# Patient Record
Sex: Female | Born: 1935 | Race: White | Hispanic: No | Smoking: Never smoker
Health system: Southern US, Community
[De-identification: ages and names within clinical notes are randomized; demographics above are authoritative.]

## PROBLEM LIST (undated history)

## (undated) DIAGNOSIS — I509 Heart failure, unspecified: Secondary | ICD-10-CM

## (undated) DIAGNOSIS — Z8719 Personal history of other diseases of the digestive system: Secondary | ICD-10-CM

## (undated) DIAGNOSIS — F419 Anxiety disorder, unspecified: Secondary | ICD-10-CM

## (undated) DIAGNOSIS — I10 Essential (primary) hypertension: Secondary | ICD-10-CM

## (undated) DIAGNOSIS — E7800 Pure hypercholesterolemia, unspecified: Secondary | ICD-10-CM

## (undated) DIAGNOSIS — F32A Depression, unspecified: Secondary | ICD-10-CM

## (undated) DIAGNOSIS — K219 Gastro-esophageal reflux disease without esophagitis: Secondary | ICD-10-CM

## (undated) DIAGNOSIS — F329 Major depressive disorder, single episode, unspecified: Secondary | ICD-10-CM

## (undated) DIAGNOSIS — I2510 Atherosclerotic heart disease of native coronary artery without angina pectoris: Secondary | ICD-10-CM

## (undated) DIAGNOSIS — R519 Headache, unspecified: Secondary | ICD-10-CM

## (undated) DIAGNOSIS — I350 Nonrheumatic aortic (valve) stenosis: Secondary | ICD-10-CM

## (undated) DIAGNOSIS — R51 Headache: Secondary | ICD-10-CM

## (undated) DIAGNOSIS — I340 Nonrheumatic mitral (valve) insufficiency: Secondary | ICD-10-CM

## (undated) DIAGNOSIS — I219 Acute myocardial infarction, unspecified: Secondary | ICD-10-CM

## (undated) DIAGNOSIS — K449 Diaphragmatic hernia without obstruction or gangrene: Secondary | ICD-10-CM

## (undated) DIAGNOSIS — Z8711 Personal history of peptic ulcer disease: Secondary | ICD-10-CM

## (undated) DIAGNOSIS — G8929 Other chronic pain: Secondary | ICD-10-CM

## (undated) HISTORY — PX: ABDOMINAL HYSTERECTOMY: SHX81

## (undated) HISTORY — PX: SHOULDER SURGERY: SHX246

## (undated) HISTORY — PX: HIP FRACTURE SURGERY: SHX118

---

## 2002-07-12 ENCOUNTER — Emergency Department (HOSPITAL_COMMUNITY): Admission: EM | Admit: 2002-07-12 | Discharge: 2002-07-12 | Payer: Self-pay | Admitting: *Deleted

## 2002-07-12 ENCOUNTER — Encounter: Payer: Self-pay | Admitting: *Deleted

## 2002-07-16 ENCOUNTER — Ambulatory Visit (HOSPITAL_COMMUNITY): Admission: RE | Admit: 2002-07-16 | Discharge: 2002-07-16 | Payer: Self-pay | Admitting: *Deleted

## 2002-07-16 ENCOUNTER — Encounter: Payer: Self-pay | Admitting: *Deleted

## 2002-07-23 ENCOUNTER — Encounter: Payer: Self-pay | Admitting: Emergency Medicine

## 2002-07-23 ENCOUNTER — Inpatient Hospital Stay (HOSPITAL_COMMUNITY): Admission: EM | Admit: 2002-07-23 | Discharge: 2002-07-27 | Payer: Self-pay | Admitting: Emergency Medicine

## 2002-07-25 ENCOUNTER — Encounter (INDEPENDENT_AMBULATORY_CARE_PROVIDER_SITE_OTHER): Payer: Self-pay | Admitting: Internal Medicine

## 2002-10-21 ENCOUNTER — Other Ambulatory Visit: Admission: RE | Admit: 2002-10-21 | Discharge: 2002-10-21 | Payer: Self-pay | Admitting: Dermatology

## 2003-01-28 ENCOUNTER — Inpatient Hospital Stay (HOSPITAL_COMMUNITY): Admission: EM | Admit: 2003-01-28 | Discharge: 2003-01-30 | Payer: Self-pay | Admitting: Emergency Medicine

## 2003-01-28 ENCOUNTER — Encounter: Payer: Self-pay | Admitting: Emergency Medicine

## 2003-05-31 DIAGNOSIS — I219 Acute myocardial infarction, unspecified: Secondary | ICD-10-CM

## 2003-05-31 HISTORY — DX: Acute myocardial infarction, unspecified: I21.9

## 2006-07-20 ENCOUNTER — Ambulatory Visit (HOSPITAL_COMMUNITY): Admission: RE | Admit: 2006-07-20 | Discharge: 2006-07-20 | Payer: Self-pay | Admitting: Pulmonary Disease

## 2006-07-28 ENCOUNTER — Emergency Department (HOSPITAL_COMMUNITY): Admission: EM | Admit: 2006-07-28 | Discharge: 2006-07-28 | Payer: Self-pay | Admitting: Emergency Medicine

## 2006-08-31 ENCOUNTER — Encounter (HOSPITAL_COMMUNITY): Admission: RE | Admit: 2006-08-31 | Discharge: 2006-09-30 | Payer: Self-pay | Admitting: Pulmonary Disease

## 2006-10-02 ENCOUNTER — Encounter (HOSPITAL_COMMUNITY): Admission: RE | Admit: 2006-10-02 | Discharge: 2006-11-01 | Payer: Self-pay | Admitting: Pulmonary Disease

## 2006-11-03 ENCOUNTER — Encounter (HOSPITAL_COMMUNITY): Admission: RE | Admit: 2006-11-03 | Discharge: 2006-12-03 | Payer: Self-pay | Admitting: Pulmonary Disease

## 2006-12-04 ENCOUNTER — Encounter (HOSPITAL_COMMUNITY): Admission: RE | Admit: 2006-12-04 | Discharge: 2006-12-12 | Payer: Self-pay | Admitting: Pulmonary Disease

## 2006-12-13 ENCOUNTER — Encounter (HOSPITAL_COMMUNITY): Admission: RE | Admit: 2006-12-13 | Discharge: 2007-01-12 | Payer: Self-pay | Admitting: Pulmonary Disease

## 2007-01-15 ENCOUNTER — Encounter (HOSPITAL_COMMUNITY): Admission: RE | Admit: 2007-01-15 | Discharge: 2007-02-14 | Payer: Self-pay | Admitting: Pulmonary Disease

## 2007-02-15 ENCOUNTER — Encounter (HOSPITAL_COMMUNITY): Admission: RE | Admit: 2007-02-15 | Discharge: 2007-02-27 | Payer: Self-pay | Admitting: Pulmonary Disease

## 2007-03-19 ENCOUNTER — Inpatient Hospital Stay (HOSPITAL_COMMUNITY): Admission: EM | Admit: 2007-03-19 | Discharge: 2007-03-21 | Payer: Self-pay | Admitting: Emergency Medicine

## 2007-04-04 ENCOUNTER — Ambulatory Visit (HOSPITAL_COMMUNITY): Admission: EM | Admit: 2007-04-04 | Discharge: 2007-04-04 | Payer: Self-pay | Admitting: *Deleted

## 2007-04-16 ENCOUNTER — Ambulatory Visit (HOSPITAL_COMMUNITY): Admission: RE | Admit: 2007-04-16 | Discharge: 2007-04-16 | Payer: Self-pay | Admitting: Pulmonary Disease

## 2007-04-25 ENCOUNTER — Encounter (HOSPITAL_COMMUNITY): Admission: RE | Admit: 2007-04-25 | Discharge: 2007-05-25 | Payer: Self-pay | Admitting: Pulmonary Disease

## 2007-05-28 ENCOUNTER — Encounter (HOSPITAL_COMMUNITY): Admission: RE | Admit: 2007-05-28 | Discharge: 2007-05-30 | Payer: Self-pay | Admitting: Pulmonary Disease

## 2007-09-03 ENCOUNTER — Inpatient Hospital Stay (HOSPITAL_COMMUNITY): Admission: RE | Admit: 2007-09-03 | Discharge: 2007-09-06 | Payer: Self-pay | Admitting: Orthopedic Surgery

## 2007-12-21 ENCOUNTER — Encounter (HOSPITAL_COMMUNITY): Admission: RE | Admit: 2007-12-21 | Discharge: 2008-01-20 | Payer: Self-pay | Admitting: Pulmonary Disease

## 2008-01-21 ENCOUNTER — Encounter: Admission: RE | Admit: 2008-01-21 | Discharge: 2008-02-20 | Payer: Self-pay | Admitting: Pulmonary Disease

## 2008-02-22 ENCOUNTER — Encounter (HOSPITAL_COMMUNITY): Admission: RE | Admit: 2008-02-22 | Discharge: 2008-03-23 | Payer: Self-pay | Admitting: Pulmonary Disease

## 2008-04-02 ENCOUNTER — Encounter (HOSPITAL_COMMUNITY): Admission: RE | Admit: 2008-04-02 | Discharge: 2008-05-02 | Payer: Self-pay | Admitting: Pulmonary Disease

## 2008-05-24 ENCOUNTER — Emergency Department (HOSPITAL_COMMUNITY): Admission: EM | Admit: 2008-05-24 | Discharge: 2008-05-24 | Payer: Self-pay | Admitting: Emergency Medicine

## 2008-08-27 ENCOUNTER — Encounter (HOSPITAL_COMMUNITY): Admission: RE | Admit: 2008-08-27 | Discharge: 2008-09-26 | Payer: Self-pay | Admitting: Pulmonary Disease

## 2008-09-29 ENCOUNTER — Encounter (HOSPITAL_COMMUNITY): Admission: RE | Admit: 2008-09-29 | Discharge: 2008-10-29 | Payer: Self-pay | Admitting: Pulmonary Disease

## 2010-10-12 NOTE — H&P (Signed)
NAMEJAZLEN, OGARRO               ACCOUNT NO.:  [id number]   MEDICAL RECORD NO.:  [id number]          PATIENT TYPE:  INP   LOCATION:  A227                          FACILITY:  [facility]   PHYSICIAN:  Edward L. Juanetta Gosling, M.D.DATE OF BIRTH:  21-Aug-1919   DATE OF ADMISSION:  03/19/2007  DATE OF DISCHARGE:  LH                              HISTORY & PHYSICAL   HISTORY OF PRESENT ILLNESS:  Ms.  Chiao came to my office on the  morning of admission complaining of chest pain, and I told her she  needed to go to the emergency room rather than come to my office.  She  said it started about 12 hours prior to admission.  She did have some  cough.  She had pain when she coughed, but she has a history of  myocardial infarction and had congestive heart failure following that,  so it was felt better not to take any chances.  Her pain is described as  dull and squeezing.  It was aggravated by cough.  She had any fever.  She had not coughed anything up.   PAST MEDICAL HISTORY:  1. Coronary artery occlusive disease status post MI.  She has      congestive heart failure following that, although she has had a      markedly improved echocardiogram since.  2. Hypertension.  3. Chronic headaches.   SOCIAL HISTORY:  She has been living alone, but recently living with a  family member here in town.  She is widowed.  She does not smoke.  She  does not drink any alcohol.  She does not use any illicit drugs.   FAMILY HISTORY:  Both parents died of strokes.  She has a brother who  has high blood pressure another one who died of a throat cancer.   MEDICATIONS AT HOME:  1. Potassium chloride 20 mEq daily.  2. Protonix 40 mg daily.  3. Zocor 40 mg daily.  4. Ambien 10 mg at bedtime.  5. Enteric-coated aspirin 81 mg daily.  6. Coreg 3.125 mg b.i.d.  7. Lanoxin 125 mcg daily.  8. Lasix 40 mg daily.  9. Lexapro 10 mg daily.  10.Xanax 0.25 mg b.i.d. if needed.   REVIEW OF SYSTEMS:  She has had significant  problems with  anxiety/depression following her MI and CHF.  She also does have a  history of hyperlipidemia and of gastroesophageal reflux disease.  Her  review of systems, other than as mentioned, is pretty well negative now.   PHYSICAL EXAMINATION:  VITAL SIGNS:  Temperature is 98.8, pulse 80,  respirations 22, blood pressure 127/82, O2 sats 97% on 2 liters.  HEENT:  Her pupils are reactive.  Her nose and throat are clear.  She  does not have any jugular venous distention.  Her chest shows some  rhonchi bilaterally.  HEART:  Regular without gallop.  She has a loud systolic murmur about a  4-5/6.  EXTREMITIES:  Showed no edema.  ABDOMEN:  Soft.  No masses felt.  Organs are not palpable.  Bowel sounds  present and active.  CENTRAL  NERVOUS SYSTEM:  Exam is grossly intact.   LABORATORY DATA:  BNP is 233.  Point of care cardiac markers are normal.  B-met:  BUN 13, creatinine 1.1.  CBC shows white count 5900, hemoglobin  10.9, platelets 224.  Her chest x-ray shows chronic lung disease,  bronchitic changes and what looks like patchy bilateral infiltrates,  probably pneumonia.   ASSESSMENT:  She has chest pain which appears to be noncardiac in  nature.  She does have history of coronary disease.  She has a history  of CHF which is improved after her MI.  She has gastroesophageal reflux  disease.  She has had some anxiety and depression.   PLAN:  Go ahead and treat her with antibiotics.  Continue with her home  medications, and she is going to be on Lovenox for prophylaxis, O2,  Rocephin and Zithromax, and otherwise as mentioned, continue with her  home medications.      Edward L. Juanetta Gosling, M.D.  Electronically Signed     ELH/MEDQ  D:  03/19/2007  T:  03/20/2007  Job:  [id number]

## 2010-10-12 NOTE — Group Therapy Note (Signed)
NAMEZAHRAH, [name]               ACCOUNT NO.:  [id number]   MEDICAL RECORD NO.:  [id number]          PATIENT TYPE:  INP   LOCATION:  A227                          FACILITY:  [facility]   PHYSICIAN:  Edward L. Juanetta Gosling, M.D.DATE OF BIRTH:  [birth year]   DATE OF PROCEDURE:  DATE OF DISCHARGE:                                 PROGRESS NOTE   Ms. Balik admitted with chest discomfort and found to have bilateral  infiltrates with pneumonia even though her white count was not  particularly high.  Her chest discomfort as been when she coughs.  She  does have a significant history of coronary artery occlusive disease and  congestive heart failure following an MI.  She says that she slept well  last night.  She has no new complaints.  Her chest pain is getting  better.   PHYSICAL EXAMINATION:  Temperature 97.5, pulse 82, respirations 18,  blood pressure 147/69, O2 sat 91% on room air.  Her chest is clearer than it was.   Cardiac enzymes which ordered because of her significant history of  coronary disease, is negative.  EKG looks okay.   ASSESSMENT:  She has pneumonia.  Her chest is relatively clear.  Her  heart does show a loud systolic murmur which is unchanged.  She has a  history of coronary artery occlusive disease which is stable.  No  evidence of a myocardial infarction.  She has congestive heart failure  following the myocardial infarction and her last echocardiogram showed  pretty good left ventricular function.  This was done in [place]  and I do not have the numbers, but I think her ejection fraction was  somewhere over 40%.  Assessment is that she is improving.   PLAN:  To try to get her up and moving around some.      Edward L. Juanetta Gosling, M.D.  Electronically Signed     ELH/MEDQ  D:  03/20/2007  T:  03/20/2007  Job:  [id number]

## 2010-10-12 NOTE — Discharge Summary (Signed)
NAMEKRUTI, HORACEK               ACCOUNT NO.:  [id number]   MEDICAL RECORD NO.:  [id number]          PATIENT TYPE:  INP   LOCATION:  A227                          FACILITY:  [facility]   PHYSICIAN:  Edward L. Juanetta Gosling, M.D.DATE OF BIRTH:  07/26/1919   DATE OF ADMISSION:  03/19/2007  DATE OF DISCHARGE:  10/22/2008LH                               DISCHARGE SUMMARY   FINAL DISCHARGE DIAGNOSES:  1. Pneumonia, organism unknown  2. Coronary artery occlusive disease status post MI  3. Congestive heart failure following her acute MI although with      improved ejection fraction since  4. Hypertension  5. Chronic headaches  6. Gastroesophageal reflux disease  7. Hyperlipidemia  8. Depression  9. Anxiety  10.Inflammatory skin reaction to Pneumovax.   HISTORY:  Ms. [name] is an 77 year old who actually arrived at my office  on the morning admission complaining of chest pain and was directed from  there to the emergency room because of her previous cardiac history.  She has had a history of a myocardial infarction followed by congestive  heart failure and because of that she was sent to the emergency room.  She says her pain is dull and squeezing aggravated by cough.  She has  not had any fever.  In the emergency room she was found to have  temperature 98.8, pulse 80, respirations 22, blood pressure 127/82, O2  sat was 97%.  She had a loud systolic murmur, about 4/6.  She had some  rhonchi bilaterally.  Her cardiac markers were negative.  EKG did not  show any acute changes.  Chest x-ray showed chronic lung disease but  what looked like patchy bilateral infiltrates felt to be pneumonia.  At  the time of admission then she was felt to have pneumonia and noncardiac  chest discomfort but with a history of coronary disease and she was  started on Rocephin and Zithromax.  She improved markedly, received a  Pneumovax, had some inflammation in her arm from the Pneumovax, but by  the time of discharge  was back to baseline.  She is being sent home on  Ceftin 250 mg b.i.d., Norel DM 8 ounces 5 mL q.4 h p.r.n. cough.  She  did use ice on her left arm.  And she is going to take a Zithromax Z-Pak  #1 as directed.  She is going to continue with some potassium chloride  20 mEq daily, Protonix 40 mg daily, Zocor 40 mg daily, Ambien 10 mg at  bedtime, enteric-coated aspirin 81 mg daily, Coreg 3.125 mg b.i.d.,  Lanoxin 125 mcg daily, Lasix 40 mg daily, Lexapro 10 mg daily, Xanax .25  t.i.d..  She is going to have home health services to make sure she does  okay.  She will follow up in my office.      Edward L. Juanetta Gosling, M.D.  Electronically Signed    ELH/MEDQ  D:  03/21/2007  T:  03/21/2007  Job:  [id number]

## 2010-10-12 NOTE — Op Note (Signed)
[name], [name]               ACCOUNT NO.:  [id number]   MEDICAL RECORD NO.:  [id number]          PATIENT TYPE:  INP   LOCATION:  5024                         FACILITY:  [facility]   PHYSICIAN:  Almedia Balls. Ranell Patrick, M.D. DATE OF BIRTH:  11-18-1919   DATE OF PROCEDURE:  09/03/2007  DATE OF DISCHARGE:                               OPERATIVE REPORT   PREOPERATIVE DIAGNOSIS:  Right displaced proximal humerus fracture.   POSTOPERATIVE DIAGNOSIS:  Right displaced proximal humerus fracture.   PROCEDURE:  ORIF right proximal humerus using Dupuy SNP nail plate  system.   SURGEON:  Almedia Balls. Ranell Patrick, M.D.   ASSISTANT:  Donnie Coffin. Durwin Nora, P.A.   ANESTHESIA:  General anesthesia.  Standard scalene block was used.   ESTIMATED BLOOD LOSS:  Minimal.   FLUIDS REPLACED:  700 mL crystalloid.   URINE OUTPUT:  150 mL.   Needle, sponge, and instrument counts correct.   COMPLICATIONS:  None.  Preoperative antibiotics were given.   INDICATIONS FOR PROCEDURE:  The patient is an 77 year old female with a  history of an injury to the right shoulder who presented with displaced  three part proximal humerus fracture with valgus impacted humeral head  and split off greater tuberosity.  The patient presents now with  significant pain and deformity desiring operative treatment.  Informed  consent was obtained.   DESCRIPTION OF PROCEDURE:  After adequate level of anesthesia was  obtained, the patient was positioned in the modified beach chair  position.  All neurovascular structures were padded appropriately.  The  right shoulder and arm was sterilely prepped and draped in the usual  fashion.  We used a modified deltopectoral approach just lateral to the  normal deltopectoral area.  Dissection was carried sharply down through  the subcutaneous tissues, we identified the cephalic vein and took it  lateral to the deltoid.  We identified the fracture just lateral to the  bicipital groove.  We went ahead and  identified the greater tuberosity  which was free and posterior.  We grabbed that and brought it around  with Kocher and then placed two #2 Fiberwire sutures in a mattress  fashion posterior to the greater tuberosity and into the rotator cuff  tendon which was intact.  We would use this to repair the greater  tuberosity later.  Next we went ahead and noted there to be a completely  healed over shaft over her canal indicative of a prior proximal humerus  fracture.  We actually had to use sequential drill bits to drill open  her canal and then used an awl to open the canal.  I then placed a Dupuy  nail plate just posterior to the biceps groove in the appropriate  position.  We then reduced the humeral head and fired a single guide pin  center on the head.  We next placed five smooth pegs into the head with  appropriate length pegs and locking those into the locking plate.  We  then went ahead and directed our attention distal and these pegs looked  good on both the AP and lateral plane.  We  then directed our attention  distal to the shaft screws and we placed two of those through a jig.  The third would not fit due to deformity from the patient's prior  proximal humerus fracture that she had previously healed.  At this point  we thoroughly irrigated, obtained final x-rays, and then closed the  greater tuberosity back into an anatomic position using the upper two  holes on the nail plate as well as suturing over to the subscapularis  with the Fiberwire.  We had an anatomic reduction of the greater  tuberosity.  We then thoroughly irrigated and closed the wounds with  layered closure with Vicryl and Monocryl.  Sterile compressive dressing  and shoulder sling were applied.      Almedia Balls. Ranell Patrick, M.D.  Electronically Signed     SRN/MEDQ  D:  09/03/2007  T:  09/03/2007  Job:  [id number]

## 2010-10-12 NOTE — Discharge Summary (Signed)
[name], [name]               ACCOUNT NO.:  [id number]   MEDICAL RECORD NO.:  [id number]          PATIENT TYPE:  INP   LOCATION:  5034                         FACILITY:  [facility]   PHYSICIAN:  Almedia Balls. Ranell Patrick, M.D. DATE OF BIRTH:  [birth year]   DATE OF ADMISSION:  09/03/2007  DATE OF DISCHARGE:  09/06/2007                               DISCHARGE SUMMARY   ADMISSION DIAGNOSES:  Right proximal humerus fracture.   DISCHARGE DIAGNOSES:  Right proximal humerus fracture, status post open  reduction and internal fixation.   BRIEF HISTORY:  The patient is an 77 year old female with a recent fall  injuring the right proximal humerus comes in with displacement inability  to use right upper extremity.  Thus, she has elected to have Dr. Almedia Balls. Norris complete surgical management for this fracture.   PROCEDURE:  The patient had open reduction and internal fixation of the  right proximal humerus using a DePuy shoulder nail plate system on September 03, 2007.   ASSISTANT:  Donnie Coffin. Dixon, P.A.   ESTIMATED BLOOD LOSS:  Minimal.   COMPLICATIONS:  None.   HOSPITAL COURSE:  The patient was admitted on September 03, 2007, for the  above-stated procedure, which she tolerated well.  After adequate time  of postanesthesia care, she was transferred up to 5000.  Postop day 1,  the patient complained of moderate pain to the right shoulder, and she  was able to work with occupational therapy gently, kept in a sling.  On  postop day 2, she was doing much better in regards to her pain, thus it  is decided skilled nursing will be the best place for [name] for  discharge planning.  On postop day 3, the patient was doing quite well.  Sitting up and reading without any difficulty, doing small things with  the right upper extremity.  The incision was healing well.  No signs of  cellulitis, erythema, or infection.  Neurovascularly, she was intact  distally.  Her labs within acceptable limits throughout her  entire stay.   DISCHARGE PLAN:  The patient will be discharged to [facility] on September 06, 2007.   FOLLOWUP:  The patient to follow up back with Dr. Almedia Balls. Norris in 2  weeks.   DIET:  Regular.   CONDITION:  Stable.   ALLERGIES:  CODEINE AND ASPIRIN.   DISCHARGE MEDICATIONS:  1. Aspirin 81 mg p.o. daily.  2. Coreg 3.125 mg p.o. b.i.d.  3. Lexapro 10 mg p.o. daily.  4. Lasix 40 mg p.o. b.i.d.  5. Robaxin 500 mg p.o. q.6 h.  6. Zocor 40 mg p.o. nightly.  7. Travatan one drop each eye nightly.  8. Vicodin 5/325 one to two tablets q.4-6 h. p.r.n. pain.  9. Ambien 5 mg p.o. nightly p.r.n.      Thomas B. Durwin Nora, P.A.      Almedia Balls. Ranell Patrick, M.D.  Electronically Signed    TBD/MEDQ  D:  09/06/2007  T:  09/06/2007  Job:  [id number]

## 2010-10-12 NOTE — H&P (Signed)
[name], [name]               ACCOUNT NO.:  [id number]   MEDICAL RECORD NO.:  [id number]          PATIENT TYPE:  REC   LOCATION:  CREH                          FACILITY:  [facility]   PHYSICIAN:  Almedia Balls. Ranell Patrick, M.D. DATE OF BIRTH:  1920-04-15   DATE OF ADMISSION:  08/30/2007  DATE OF DISCHARGE:                              HISTORY & PHYSICAL   CHIEF COMPLAINT:  Right shoulder pain.   HISTORY OF PRESENT ILLNESS:  The patient is an 76 year old female, who  sustained a ground level fall today off of a treadmill injuring her  right shoulder and upper extremity.  The patient denied any loss of  consciousness, denied any other injuries, denied any previous problems  with the right shoulder in the past.   PAST SURGICAL HISTORY:  A stent for her heart.   PAST MEDICAL HISTORY:  1. Hypertension.  2. Congestive heart failure.   FAMILY MEDICAL HISTORY:  Coronary artery disease.   CURRENT MEDICATIONS:  Potassium, Protonix, Zocor, Ambien, enteric-coated  aspirin, Coreg, Lanoxin, and Lasix.   DRUG ALLERGIES:  1. CODEINE.  2. ASPIRIN.   SOCIAL HISTORY:  She is a patient of Dr. Kari Baars, and also Dr.  Tiburcio Pea in Cardiology in [facility].  The patient is right-handed,  does not smoke, and is retired.   PHYSICAL EXAMINATION:  GENERAL:  This is an 77 year old female in no  acute distress, adequate mood and affect today.  HEAD AND NECK:  No tenderness to palpation of the paraspinal muscles or  spinous process.  NEURO:  Cranial nerves II-XII are grossly intact.  EXTREMITIES:  The right upper extremity shows moderate ecchymosis and  edema in the right proximal shoulder, and neurovascularly she is intact.  Distally, she has full range of motion in the right elbow and right  wrist and no signs of deformity.  Capillary refill is less than two  seconds.  Distally, examination of the left upper extremity is negative,  the pelvis is stable, and she does have a normal gait.   Radiographs  were reviewed showing a three part proximal humerus fracture  of the ulna and the right shoulder.   IMPRESSION:  Three part proximal humerus fracture of the right shoulder.   PLAN:  A right shoulder ORIF using a shoulder nail placed by Dr. Malon Kindle.      Thomas B. Durwin Nora, P.A.      Almedia Balls. Ranell Patrick, M.D.  Electronically Signed    TBD/MEDQ  D:  08/30/2007  T:  08/30/2007  Job:  [id number]

## 2010-10-12 NOTE — Group Therapy Note (Signed)
[name], [name]               ACCOUNT NO.:  [id number]   MEDICAL RECORD NO.:  [id number]          PATIENT TYPE:  INP   LOCATION:  A227                          FACILITY:  [facility]   PHYSICIAN:  Edward L. Juanetta Gosling, M.D.DATE OF BIRTH:  1919/07/25   DATE OF PROCEDURE:  DATE OF DISCHARGE:  03/21/2007                                 PROGRESS NOTE   Ms. Mennen says that she is feeling well and has no new complaints.  She  is not short of breath.  She is not coughing.  She has not got any  fever.   PHYSICAL EXAMINATION:  This morning shows she is awake and alert.  Her  left arm is somewhat erythematous where she received her Pneumovax.  We  are going to put some ice on that.  Her chest is clear.  She has a loud systolic murmur as always.  Temperature is 98, pulse 76, respirations 18, blood pressure 130/63, O2  sats 95% on room air.  Her chest is clear.  Her heart is regular.   White count this morning 6900, hemoglobin is 9.6, platelets 197,000.   ASSESSMENT:  She is much improved.   PLAN:  I am going to go and discharge her today.  Please see discharge  summary for details.      Edward L. Juanetta Gosling, M.D.  Electronically Signed     ELH/MEDQ  D:  03/21/2007  T:  03/21/2007  Job:  [id number]
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
## 2011-02-22 LAB — BASIC METABOLIC PANEL
BUN: 23
CO2: 29
CO2: 32
Calcium: 8.7
Calcium: 8.9
Chloride: 101
Creatinine, Ser: 1.12
Creatinine, Ser: 1.19
GFR calc Af Amer: 52 — ABNORMAL LOW
GFR calc non Af Amer: 43 — ABNORMAL LOW
Potassium: 3.8
Potassium: 4.2
Sodium: 138
Sodium: 140

## 2011-02-22 LAB — CBC
HCT: 26.7 — ABNORMAL LOW
MCV: 91.3

## 2011-02-22 LAB — DIFFERENTIAL
Basophils Absolute: 0
Eosinophils Absolute: 0.1

## 2011-02-22 LAB — PROTIME-INR: Prothrombin Time: 13.2

## 2011-03-09 LAB — BASIC METABOLIC PANEL
BUN: 13
CO2: 31
Chloride: 99
Creatinine, Ser: 1.1
Potassium: 4.3
Sodium: 135

## 2011-03-09 LAB — CBC
HCT: 28.1 — ABNORMAL LOW
Hemoglobin: 10.9 — ABNORMAL LOW
MCHC: 33.6
Platelets: 197
RBC: 3.16 — ABNORMAL LOW
WBC: 5.9

## 2011-03-09 LAB — DIFFERENTIAL
Basophils Absolute: 0
Basophils Relative: 0
Basophils Relative: 0
Eosinophils Absolute: 0.1
Eosinophils Relative: 2
Lymphocytes Relative: 22
Lymphs Abs: 0.7
Lymphs Abs: 1.5
Monocytes Absolute: 0.8 — ABNORMAL HIGH
Monocytes Relative: 12 — ABNORMAL HIGH
Monocytes Relative: 7
Neutro Abs: 4.4
Neutrophils Relative %: 64
Neutrophils Relative %: 78 — ABNORMAL HIGH

## 2011-03-09 LAB — CARDIAC PANEL(CRET KIN+CKTOT+MB+TROPI)
Relative Index: INVALID
Total CK: 56
Troponin I: <0.01

## 2011-03-09 LAB — B-NATRIURETIC PEPTIDE (CONVERTED LAB): Pro B Natriuretic peptide (BNP): 233 — ABNORMAL HIGH

## 2011-03-09 LAB — CULTURE, BLOOD (ROUTINE X 2): Culture: NO GROWTH

## 2012-07-18 ENCOUNTER — Inpatient Hospital Stay (HOSPITAL_COMMUNITY): Payer: Medicare Other

## 2012-07-18 ENCOUNTER — Inpatient Hospital Stay (HOSPITAL_COMMUNITY)
Admission: EM | Admit: 2012-07-18 | Discharge: 2012-07-21 | DRG: 382 | Disposition: A | Discharge status: Home / Self Care | Payer: Medicare Other | Attending: Internal Medicine | Admitting: Internal Medicine

## 2012-07-18 ENCOUNTER — Encounter (HOSPITAL_COMMUNITY): Payer: Self-pay

## 2012-07-18 ENCOUNTER — Emergency Department (HOSPITAL_COMMUNITY): Payer: Medicare Other

## 2012-07-18 DIAGNOSIS — I340 Nonrheumatic mitral (valve) insufficiency: Secondary | ICD-10-CM | POA: Diagnosis present

## 2012-07-18 DIAGNOSIS — K311 Adult hypertrophic pyloric stenosis: Secondary | ICD-10-CM | POA: Diagnosis present

## 2012-07-18 DIAGNOSIS — K3189 Other diseases of stomach and duodenum: Secondary | ICD-10-CM

## 2012-07-18 DIAGNOSIS — R1013 Epigastric pain: Secondary | ICD-10-CM

## 2012-07-18 DIAGNOSIS — K449 Diaphragmatic hernia without obstruction or gangrene: Secondary | ICD-10-CM | POA: Diagnosis present

## 2012-07-18 DIAGNOSIS — I10 Essential (primary) hypertension: Secondary | ICD-10-CM | POA: Diagnosis present

## 2012-07-18 DIAGNOSIS — I050 Rheumatic mitral stenosis: Secondary | ICD-10-CM | POA: Diagnosis present

## 2012-07-18 DIAGNOSIS — F32A Depression, unspecified: Secondary | ICD-10-CM | POA: Diagnosis present

## 2012-07-18 DIAGNOSIS — F3289 Other specified depressive episodes: Secondary | ICD-10-CM | POA: Diagnosis present

## 2012-07-18 DIAGNOSIS — F411 Generalized anxiety disorder: Secondary | ICD-10-CM | POA: Diagnosis present

## 2012-07-18 DIAGNOSIS — E785 Hyperlipidemia, unspecified: Secondary | ICD-10-CM | POA: Diagnosis present

## 2012-07-18 DIAGNOSIS — I359 Nonrheumatic aortic valve disorder, unspecified: Secondary | ICD-10-CM | POA: Diagnosis present

## 2012-07-18 DIAGNOSIS — K319 Disease of stomach and duodenum, unspecified: Secondary | ICD-10-CM | POA: Diagnosis present

## 2012-07-18 DIAGNOSIS — K310 Acute dilatation of stomach: Secondary | ICD-10-CM

## 2012-07-18 DIAGNOSIS — I2510 Atherosclerotic heart disease of native coronary artery without angina pectoris: Secondary | ICD-10-CM | POA: Diagnosis present

## 2012-07-18 DIAGNOSIS — K219 Gastro-esophageal reflux disease without esophagitis: Secondary | ICD-10-CM | POA: Diagnosis present

## 2012-07-18 DIAGNOSIS — F329 Major depressive disorder, single episode, unspecified: Secondary | ICD-10-CM | POA: Diagnosis present

## 2012-07-18 DIAGNOSIS — I519 Heart disease, unspecified: Secondary | ICD-10-CM

## 2012-07-18 DIAGNOSIS — R51 Headache: Secondary | ICD-10-CM | POA: Diagnosis present

## 2012-07-18 DIAGNOSIS — I350 Nonrheumatic aortic (valve) stenosis: Secondary | ICD-10-CM | POA: Diagnosis present

## 2012-07-18 DIAGNOSIS — I252 Old myocardial infarction: Secondary | ICD-10-CM

## 2012-07-18 DIAGNOSIS — F419 Anxiety disorder, unspecified: Secondary | ICD-10-CM | POA: Diagnosis present

## 2012-07-18 DIAGNOSIS — E876 Hypokalemia: Secondary | ICD-10-CM | POA: Diagnosis present

## 2012-07-18 HISTORY — DX: Headache: R51

## 2012-07-18 HISTORY — DX: Diaphragmatic hernia without obstruction or gangrene: K44.9

## 2012-07-18 HISTORY — DX: Gastro-esophageal reflux disease without esophagitis: K21.9

## 2012-07-18 HISTORY — DX: Personal history of other diseases of the digestive system: Z87.19

## 2012-07-18 HISTORY — DX: Atherosclerotic heart disease of native coronary artery without angina pectoris: I25.10

## 2012-07-18 HISTORY — DX: Personal history of peptic ulcer disease: Z87.11

## 2012-07-18 HISTORY — DX: Nonrheumatic mitral (valve) insufficiency: I34.0

## 2012-07-18 HISTORY — DX: Anxiety disorder, unspecified: F41.9

## 2012-07-18 HISTORY — DX: Headache, unspecified: R51.9

## 2012-07-18 HISTORY — DX: Essential (primary) hypertension: I10

## 2012-07-18 HISTORY — DX: Major depressive disorder, single episode, unspecified: F32.9

## 2012-07-18 HISTORY — DX: Nonrheumatic aortic (valve) stenosis: I35.0

## 2012-07-18 HISTORY — DX: Depression, unspecified: F32.A

## 2012-07-18 HISTORY — DX: Other chronic pain: G89.29

## 2012-07-18 HISTORY — DX: Pure hypercholesterolemia, unspecified: E78.00

## 2012-07-18 LAB — COMPREHENSIVE METABOLIC PANEL
ALT: 5 U/L (ref 0–35)
AST: 14 U/L (ref 0–37)
Albumin: 4.2 g/dL (ref 3.5–5.2)
CO2: 33 mEq/L — ABNORMAL HIGH (ref 19–32)
Chloride: 97 mEq/L (ref 96–112)
Creatinine, Ser: 1.31 mg/dL — ABNORMAL HIGH (ref 0.50–1.10)
GFR calc non Af Amer: 35 mL/min — ABNORMAL LOW (ref >90)
Potassium: 3.2 mEq/L — ABNORMAL LOW (ref 3.5–5.1)
Sodium: 142 mEq/L (ref 135–145)
Total Bilirubin: 0.5 mg/dL (ref 0.3–1.2)

## 2012-07-18 LAB — CBC WITH DIFFERENTIAL/PLATELET
Basophils Absolute: 0 10*3/uL (ref 0.0–0.1)
Basophils Relative: 0 % (ref 0–1)
HCT: 35.1 % — ABNORMAL LOW (ref 36.0–46.0)
Lymphocytes Relative: 17 % (ref 12–46)
MCHC: 32.5 g/dL (ref 30.0–36.0)
Neutro Abs: 5.8 10*3/uL (ref 1.7–7.7)
Neutrophils Relative %: 75 % (ref 43–77)
Platelets: 249 10*3/uL (ref 150–400)
RDW: 13.5 % (ref 11.5–15.5)
WBC: 7.7 10*3/uL (ref 4.0–10.5)

## 2012-07-18 LAB — TROPONIN I: Troponin I: <0.3 ng/mL (ref <0.30)

## 2012-07-18 MED ORDER — ONDANSETRON HCL 4 MG/2ML IJ SOLN
4.0000 mg | Freq: Once | INTRAMUSCULAR | Status: AC
  Administered 2012-07-18: 4 mg via INTRAVENOUS
  Filled 2012-07-18: qty 2

## 2012-07-18 MED ORDER — PANTOPRAZOLE SODIUM 40 MG IV SOLR
40.0000 mg | Freq: Two times a day (BID) | INTRAVENOUS | Status: DC
  Administered 2012-07-18 – 2012-07-19 (×4): 40 mg via INTRAVENOUS
  Filled 2012-07-18 (×6): qty 40

## 2012-07-18 MED ORDER — MORPHINE SULFATE 2 MG/ML IJ SOLN
1.0000 mg | INTRAMUSCULAR | Status: DC | PRN
  Administered 2012-07-18 (×2): 2 mg via INTRAVENOUS
  Filled 2012-07-18 (×2): qty 1

## 2012-07-18 MED ORDER — IOHEXOL 300 MG/ML  SOLN
50.0000 mL | Freq: Once | INTRAMUSCULAR | Status: AC | PRN
  Administered 2012-07-18: 50 mL via ORAL

## 2012-07-18 MED ORDER — POTASSIUM CHLORIDE IN NACL 20-0.9 MEQ/L-% IV SOLN
INTRAVENOUS | Status: DC
  Administered 2012-07-18 – 2012-07-19 (×3): via INTRAVENOUS
  Filled 2012-07-18 (×6): qty 1000

## 2012-07-18 MED ORDER — GI COCKTAIL ~~LOC~~
30.0000 mL | Freq: Once | ORAL | Status: AC
  Administered 2012-07-18: 30 mL via ORAL
  Filled 2012-07-18: qty 30

## 2012-07-18 MED ORDER — MORPHINE SULFATE 2 MG/ML IJ SOLN
2.0000 mg | Freq: Once | INTRAMUSCULAR | Status: AC
  Administered 2012-07-18: 2 mg via INTRAVENOUS
  Filled 2012-07-18: qty 1

## 2012-07-18 MED ORDER — HEPARIN SODIUM (PORCINE) 5000 UNIT/ML IJ SOLN
5000.0000 [IU] | Freq: Three times a day (TID) | INTRAMUSCULAR | Status: DC
  Administered 2012-07-18 – 2012-07-21 (×9): 5000 [IU] via SUBCUTANEOUS
  Filled 2012-07-18 (×12): qty 1

## 2012-07-18 MED ORDER — IOHEXOL 300 MG/ML  SOLN
80.0000 mL | Freq: Once | INTRAMUSCULAR | Status: AC | PRN
  Administered 2012-07-18: 80 mL via INTRAVENOUS

## 2012-07-18 MED ORDER — ONDANSETRON HCL 4 MG/2ML IJ SOLN
4.0000 mg | Freq: Three times a day (TID) | INTRAMUSCULAR | Status: DC
  Administered 2012-07-18 – 2012-07-20 (×7): 4 mg via INTRAVENOUS
  Filled 2012-07-18 (×7): qty 2

## 2012-07-18 NOTE — H&P (Signed)
Hospital Admission Note Date: 07/18/2012  Patient name: [name] Medical record number: [id number] Date of birth: 1919/08/04 Age: 79 y.o. Gender: female PCP: Fredirick Maudlin, MD  Medical Service: Internal medicine teaching service  Attending physician: Dr. Josem Kaufmann    1st Contact: Collier Bullock    Pager: [phone] 2nd Contact: Manson Passey    Pager: [phone] After 5 pm or weekends: 1st Contact:      Pager: [phone] 2nd Contact:      Pager: [phone]  Chief Complaint: Epigastric pain, vomiting.   History of Present Illness: Patient is a pleasant 79 year old woman with past history significant for CAD, aortic stenosis, GERD, hiatal hernia and other medical problems as per problem list who came to [facility] last night with chief complaints of epigastric and chest pain for past one week along with new onset vomiting. Patient was having worsening symptoms of her reflux disease for past week. She was complaining of more epigastric and substernal central chest pain. She describes that is worsening of her GERD. Finally she had an episode of vomiting around midnight yesterday when she decided to go to the ED.  She denies any fever, although she stays cold all the time, headaches, diarrhea, palpitations, vision changes. She did have likely upper respiratory tract infection and cough about 3 weeks ago. She reports improvement in her symptoms after NG tube placement and getting antiemetics and pain medications in ED. She lives by herself and her niece who is accompanying her in ED visits her every day and helps her.  Meds: Current Outpatient Rx  Name  Route  Sig  Dispense  Refill  . aspirin 81 MG tablet   Oral   Take 81 mg by mouth daily.         . carvedilol (COREG) 3.125 MG tablet   Oral   Take 3.125 mg by mouth 2 (two) times daily with a meal.         . cyclobenzaprine (FLEXERIL) 10 MG tablet   Oral   Take 10 mg by mouth 3 (three) times daily as needed for muscle spasms.         Marland Kitchen  escitalopram (LEXAPRO) 20 MG tablet   Oral   Take 20 mg by mouth daily.         . furosemide (LASIX) 40 MG tablet   Oral   Take 40 mg by mouth daily.         Marland Kitchen LORazepam (ATIVAN) 1 MG tablet   Oral   Take 1 mg by mouth every 12 (twelve) hours.         . mirtazapine (REMERON SOL-TAB) 15 MG disintegrating tablet   Oral   Take 15 mg by mouth at bedtime.         Marland Kitchen oxyCODONE-acetaminophen (PERCOCET/ROXICET) 5-325 MG per tablet   Oral   Take 1 tablet by mouth every 4 (four) hours as needed for pain.         . pantoprazole (PROTONIX) 40 MG tablet   Oral   Take 40 mg by mouth daily.         . potassium chloride SA (K-DUR,KLOR-CON) 20 MEQ tablet   Oral   Take 20 mEq by mouth daily.         . simvastatin (ZOCOR) 40 MG tablet   Oral   Take 40 mg by mouth every evening.         . torsemide (DEMADEX) 100 MG tablet   Oral   Take 100 mg by mouth  daily.         . travoprost, benzalkonium, (TRAVATAN) 0.004 % ophthalmic solution   Both Eyes   Place 1 drop into both eyes at bedtime.         Marland Kitchen zolpidem (AMBIEN) 5 MG tablet   Oral   Take 5 mg by mouth at bedtime as needed for sleep.           Allergies: Allergies as of 07/18/2012 - Review Complete 07/18/2012  Allergen Reaction Noted  . Allegra (fexofenadine) Nausea And Vomiting 07/18/2012  . Codeine Nausea And Vomiting 07/18/2012  . Naproxen Other (See Comments) 07/18/2012   Past Medical History  Diagnosis Date  . Coronary artery disease   . Hypercholesterolemia   . Hypertension   . Mitral insufficiency   . Aortic stenosis   . Hiatal hernia   . GERD (gastroesophageal reflux disease)   . History of stomach ulcers   . Chronic headaches   . Anxiety and depression    Past Surgical History  Procedure Laterality Date  . Abdominal hysterectomy      in her 30's  . Hip fracture surgery    . Shoulder surgery Right    No family history on file. History   Social History  . Marital Status: Widowed     Spouse Name: N/A    Number of Children: N/A  . Years of Education: N/A   Occupational History  . Not on file.   Social History Main Topics  . Smoking status: Never Smoker   . Smokeless tobacco: Not on file  . Alcohol Use: No  . Drug Use: No  . Sexually Active: Not on file   Other Topics Concern  . Not on file   Social History Narrative  . No narrative on file    Review of Systems:  as per history of present illness, all other systems reviewed and negative.  Physical Exam: Blood pressure 119/43, pulse 71, temperature 98.1 F (36.7 C), temperature source Oral, resp. rate 20, height 5\' 2"  (1.575 m), weight 140 lb (63.504 kg), SpO2 99.00%. Constitutional: Vital signs reviewed.  Patient is old and cachectic, in no acute distress and cooperative with exam. Alert and oriented x3.  Head: Normocephalic and atraumatic Mouth: no erythema or exudates, MMM Eyes: PERRL, EOMI, conjunctivae normal, No scleral icterus.  Neck: Supple, Trachea midline normal ROM, No JVD Cardiovascular: RRR, S1 normal, S2 normal, holosystolic murmur. Pulmonary/Chest: CTAB, no wheezes, rales, or rhonchi Abdominal: Distended epigastrium. Non-tender, bowel sounds hypoactive Musculoskeletal: No joint deformities, erythema, or stiffness, ROM full and no nontender Neurological: A&O x3, Strength is normal and symmetric bilaterally, cranial nerve II-XII are grossly intact, no focal motor deficit, sensory intact to light touch bilaterally.   Skin: Warm, dry and intact. No rash, cyanosis, or clubbing.  Psychiatric: Normal mood and affect. speech and behavior is normal. Judgment and thought content normal. Cognition and memory are normal.     Lab results: Basic Metabolic Panel:  Recent Labs  40/98/11 0105  NA 142  K 3.2*  CL 97  CO2 33*  GLUCOSE 120*  BUN 23  CREATININE 1.31*  CALCIUM 9.9   Liver Function Tests:  Recent Labs  07/18/12 0105  AST 14  ALT 5  ALKPHOS 112  BILITOT 0.5  PROT 7.6   ALBUMIN 4.2    Recent Labs  07/18/12 0105  LIPASE 40   CBC:  Recent Labs  07/18/12 0105  WBC 7.7  NEUTROABS 5.8  HGB 11.4*  HCT 35.1*  MCV 90.2  PLT 249   Cardiac Enzymes:  Recent Labs  07/18/12 0105  TROPONINI <0.30    Imaging results:  Ct Chest W Contrast  07/18/2012  *RADIOLOGY REPORT*  Clinical Data:  Epigastric pain, chest pain.  History of hiatal hernia.  CT CHEST, ABDOMEN AND PELVIS WITH CONTRAST  Technique:  Multidetector CT imaging of the chest, abdomen and pelvis was performed following the standard protocol during bolus administration of intravenous contrast.  Contrast: 1 OMNIPAQUE IOHEXOL 300 MG/ML  SOLN, 80mL OMNIPAQUE IOHEXOL 300 MG/ML  SOLN .  Comparison:  07/18/2012 radiograph  CT CHEST  Findings:  Enlarged thyroid with multiple nodules of varying complexity.  Normal caliber aorta.  Heart displaced anteriorly by the large the hiatal hernia.  There is organoaxial rotation.  The distal stomach and small bowel loops are decompressed.  No lymphadenopathy.  Central airways are patent.  Mild peripheral reticular opacities. Mild bibasilar atelectasis and trace effusions. Mild areas of mosaic attenuation/atelectasis versus air trapping.  Osteopenia.  Right shoulder hardware.  Exaggerated kyphosis.  IMPRESSION: Markedly distended stomach, partially intrathoracic, with organoaxial rotation.  Given the distal decompression, gastric volvulus is of concern.  Complex left thyroid lobe nodules.  If clinically warranted, consider ultrasound.  CT ABDOMEN AND PELVIS  Findings:  A few hepatic hypodensities are nonspecific, favor cysts.  Unremarkable biliary system, spleen, adrenal glands.  Mild dilatation of the pancreatic duct within the head/neck.  In addition, there is dilatation of the side branch versus accessory duct along the head/uncinate process.  Unremarkable adrenal glands.  Bilateral renal cysts and incompletely characterized hypodensities. No hydronephrosis or hydroureter.   Colonic diverticulosis.  The colon and small bowel are essentially decompressed.  There is mild stranding of the small bowel root mesentery.  No lymphadenopathy.  Normal caliber aorta and branch vessels.  Left adnexal cyst has a nonaggressive appearance.  Thin-walled bladder.  Absent uterus.  Partially imaged right hip hardware.  Diffuse osteopenia and multilevel degenerative change.  T12 hemangioma.  Minimal anterolisthesis of L4 on L5.  IMPRESSION: Decompressed small bowel and colon. See chest report above.  Discussed via telephone with Dr. Judd Lien at 04:15 a.m. on 07/18/2012.   Original Report Authenticated By: Jearld Lesch, M.D.    Ct Abdomen Pelvis W Contrast  07/18/2012  *RADIOLOGY REPORT*  Clinical Data:  Epigastric pain, chest pain.  History of hiatal hernia.  CT CHEST, ABDOMEN AND PELVIS WITH CONTRAST  Technique:  Multidetector CT imaging of the chest, abdomen and pelvis was performed following the standard protocol during bolus administration of intravenous contrast.  Contrast: 1 OMNIPAQUE IOHEXOL 300 MG/ML  SOLN, 80mL OMNIPAQUE IOHEXOL 300 MG/ML  SOLN .  Comparison:  07/18/2012 radiograph  CT CHEST  Findings:  Enlarged thyroid with multiple nodules of varying complexity.  Normal caliber aorta.  Heart displaced anteriorly by the large the hiatal hernia.  There is organoaxial rotation.  The distal stomach and small bowel loops are decompressed.  No lymphadenopathy.  Central airways are patent.  Mild peripheral reticular opacities. Mild bibasilar atelectasis and trace effusions. Mild areas of mosaic attenuation/atelectasis versus air trapping.  Osteopenia.  Right shoulder hardware.  Exaggerated kyphosis.  IMPRESSION: Markedly distended stomach, partially intrathoracic, with organoaxial rotation.  Given the distal decompression, gastric volvulus is of concern.  Complex left thyroid lobe nodules.  If clinically warranted, consider ultrasound.  CT ABDOMEN AND PELVIS  Findings:  A few hepatic hypodensities  are nonspecific, favor cysts.  Unremarkable biliary system, spleen, adrenal glands.  Mild dilatation of  the pancreatic duct within the head/neck.  In addition, there is dilatation of the side branch versus accessory duct along the head/uncinate process.  Unremarkable adrenal glands.  Bilateral renal cysts and incompletely characterized hypodensities. No hydronephrosis or hydroureter.  Colonic diverticulosis.  The colon and small bowel are essentially decompressed.  There is mild stranding of the small bowel root mesentery.  No lymphadenopathy.  Normal caliber aorta and branch vessels.  Left adnexal cyst has a nonaggressive appearance.  Thin-walled bladder.  Absent uterus.  Partially imaged right hip hardware.  Diffuse osteopenia and multilevel degenerative change.  T12 hemangioma.  Minimal anterolisthesis of L4 on L5.  IMPRESSION: Decompressed small bowel and colon. See chest report above.  Discussed via telephone with Dr. Judd Lien at 04:15 a.m. on 07/18/2012.   Original Report Authenticated By: Jearld Lesch, M.D.    Dg Chest Port 1 View  07/18/2012  *RADIOLOGY REPORT*  Clinical Data: Chest and epigastric pain  PORTABLE CHEST - 1 VIEW  Comparison: Prior chest x-ray 04/16/2007  Findings: Compared to the remote prior study, there is been a marked increase in the size of the hiatal hernia which now splays the carina.  The cardiopericardial silhouette is largely obscured by the large intrathoracic hiatal hernia.  Atherosclerotic calcifications noted in the transverse aorta.  Chronic central bronchitic changes and prominence of interstitial markings similar to prior.  No acute osseous abnormality.  Prior ORIF of a right humeral neck fracture.  IMPRESSION:  1.  Marked enlargement of massive hiatal hernia compared to the prior chest x-ray from November 2008. 2.  No definite acute cardiopulmonary disease although the large hiatal hernia obscures the majority of the thorax.   Original Report Authenticated By: Malachy Moan, M.D.     Other results: EKG: normal sinus rhythm with no significant ST-T wave abnormalities concerning for ischemia/infarction.   Assessment & Plan by Problem: Principal Problem:   Acute gastric volvulus Active Problems:   Stenosis of aortic valve   CAD (coronary artery disease)   GERD (gastroesophageal reflux disease)   Hiatal hernia   HTN (hypertension)   HLD (hyperlipidemia)   Mitral insufficiency   Anxiety and depression  1) Acute gastric dilatation: Patient with likely gastric dilatation/volvulus secondary to her hiatal hernia. Unclear inciting event. Recent upper respiratory tract infection but no description of likely gastroenteritis. Chest x-ray and CT chest and abdomen concerning for volvulus without any infarct changes. Patient symptomatically better after NG tube placement, Zofran and morphine. General surgery evaluate patient and recommended internal medicine admit due to chronic medical issues. - Discussed with patient and niece in detail about possible surgical approach- to which patient denied having surgery at this age.  - Admit to inpatient on telemetry. - Gen. surgery consulting. They will be discussing with Dr. Tiburcio Pea, patient's cardiologist, for surgical risk-although patient adamant on not having surgery. - NG tube for decompression. N.p.o. - IV Protonix, Zofran and morphine. - Gentle hydration with normal saline 75cc/hr with 20 mEq potassium for hypokalemia.  2) Cardiac disease: Patient was history of CAD status post MI about 6 years before, no stent placement, AS and mitral insufficiency - closely managed by Dr. Recardo Evangelist in [place]. - Gen. surgery to discuss with Dr. Tiburcio Pea about surgical options with her heart disease. Although patient high risk for surgery- if Nissen fundoplication is planned. - Will hold Lasix and Demadex for now. No signs of acute volume overload. - Will consider IV therapy, if needed, as patient n.p.o.   3)  Hypertension: Blood  pressure on lower side of normal on presentation. Continue gentle hydration. - Will hold off any oral blood pressure medications for now. - We'll consider when necessary IV Antihypertensives if felt needed.  4) Hypokalemia: Potassium 3.2. Patient on chronic home replacement. Unclear etiology. - Replete in IV fluids.  5) DVT prophylaxis: Subcutaneous heparin.  Dispo: Disposition is deferred at this time, awaiting improvement of current medical problems. Anticipated discharge in approximately 2-3 day(s).   The patient does have a current PCP (HAWKINS,EDWARD L, MD), therefore will not be requiring OPC follow-up after discharge.   The patient does not have transportation limitations that hinder transportation to clinic appointments.  Signed: Lala Been 07/18/2012, 10:45 AM

## 2012-07-18 NOTE — ED Provider Notes (Addendum)
History     CSN: [id number]  Arrival date & time 07/18/12  0040   First MD Initiated Contact with Patient 07/18/12 0051      Chief Complaint  Patient presents with  . Chest Pain    (Consider location/radiation/quality/duration/timing/severity/associated sxs/prior treatment) HPI Comments: Patient presents with a two day history of epigastric discomfort, today got worse and began to vomit.  She denies fevers or chills.  She does have a history of CAD/MI but these symptoms are different.  Patient is a 79 y.o. female presenting with chest pain. The history is provided by the patient.  Chest Pain Pain location:  Epigastric Pain quality: sharp   Pain radiates to:  L shoulder Pain radiates to the back: no   Pain severity:  Moderate Onset quality:  Gradual Duration:  2 days Timing:  Constant Progression:  Worsening Chronicity:  New   Past Medical History  Diagnosis Date  . Coronary artery disease   . Hypercholesterolemia   . Hypertension   . Mitral insufficiency   . Aortic stenosis     History reviewed. No pertinent past surgical history.  No family history on file.  History  Substance Use Topics  . Smoking status: Never Smoker   . Smokeless tobacco: Not on file  . Alcohol Use: No    OB History   Grav Para Term Preterm Abortions TAB SAB Ect Mult Living                  Review of Systems  Cardiovascular: Positive for chest pain.  All other systems reviewed and are negative.    Allergies  Allegra; Codeine; and Naproxen  Home Medications   Current Outpatient Rx  Name  Route  Sig  Dispense  Refill  . aspirin 81 MG tablet   Oral   Take 81 mg by mouth daily.         . carvedilol (COREG) 3.125 MG tablet   Oral   Take 3.125 mg by mouth 2 (two) times daily with a meal.         . escitalopram (LEXAPRO) 20 MG tablet   Oral   Take 20 mg by mouth daily.         . mirtazapine (REMERON SOL-TAB) 15 MG disintegrating tablet   Oral   Take 15 mg by mouth  at bedtime.         Marland Kitchen oxyCODONE-acetaminophen (PERCOCET/ROXICET) 5-325 MG per tablet   Oral   Take 1 tablet by mouth every 4 (four) hours as needed for pain.         . pantoprazole (PROTONIX) 40 MG tablet   Oral   Take 40 mg by mouth daily.         . potassium chloride SA (K-DUR,KLOR-CON) 20 MEQ tablet   Oral   Take 20 mEq by mouth daily.         . simvastatin (ZOCOR) 40 MG tablet   Oral   Take 40 mg by mouth every evening.         . torsemide (DEMADEX) 100 MG tablet   Oral   Take 100 mg by mouth daily.         . travoprost, benzalkonium, (TRAVATAN) 0.004 % ophthalmic solution   Both Eyes   Place 1 drop into both eyes at bedtime.         Marland Kitchen zolpidem (AMBIEN) 5 MG tablet   Oral   Take 5 mg by mouth at bedtime as needed for sleep.  BP 139/85  Pulse 89  Temp(Src) 99.4 F (37.4 C) (Oral)  Ht 5\' 2"  (1.575 m)  Wt 140 lb (63.504 kg)  BMI 25.6 kg/m2  SpO2 96%  Physical Exam  Nursing note and vitals reviewed. Constitutional: She is oriented to person, place, and time. She appears well-developed and well-nourished. No distress.  HENT:  Head: Normocephalic and atraumatic.  Neck: Normal range of motion. Neck supple.  Cardiovascular: Normal rate and regular rhythm.  Exam reveals no gallop and no friction rub.   No murmur heard. Pulmonary/Chest: Effort normal and breath sounds normal. No respiratory distress. She has no wheezes.  Abdominal: Soft. Bowel sounds are normal. She exhibits no distension.  There is ttp in the epigastric region without rebound or guarding.  Musculoskeletal: Normal range of motion.  Neurological: She is alert and oriented to person, place, and time.  Skin: Skin is warm and dry. She is not diaphoretic.    ED Course  Procedures (including critical care time)  Labs Reviewed  CBC WITH DIFFERENTIAL  COMPREHENSIVE METABOLIC PANEL  LIPASE, BLOOD  TROPONIN I   No results found.   No diagnosis found.   Date: 07/18/2012   Rate: 86  Rhythm: normal sinus rhythm  QRS Axis: normal  Intervals: normal  ST/T Wave abnormalities: normal  Conduction Disutrbances:none  Narrative Interpretation:   Old EKG Reviewed: unchanged    MDM  The patient presents here with epigastric pain for the past two days, worse today with nausea and vomiting.  She is unable to eat or drink without vomiting.  The workup today reveals no leukocytosis and lipase and lfts are unremarkable.  The plain film of the chest revealed a large hiatal hernia that appears to have increased in size and takes up significant volume of the chest cavity.  I followed this up with a ct of the chest/abd/pelvis.  This revealed a markedly enlarged stomach with decompression of the distal bowel, raising concern for gastric volvulus.  I have spoken with Dr. Luisa Hart at [facility] as there are no beds at [facility].  He has recommended an NGT placed for decompression of the stomach and transfer to [facility] for further evaluation.  The NGT is in the process of being placed and arrangements are being made for transfer.        Geoffery Lyons, MD 07/18/12 [id number]  Geoffery Lyons, MD 07/18/12 [phone]

## 2012-07-18 NOTE — Consult Note (Signed)
[name] 1920-04-26  [id number].   Primary Care MD: Dr. Kari Baars and Cardiologist Dr. Revonda Standard. Requesting MD: Dr. Geoffery Lyons, EDP Chief Complaint/Reason for Consult: Epigastric Abd Pain, N/V HPI:  79 y/o female  With PMH of CAD, HTN, Mitral insuff, AS, Hiatal hernia, and GERD presents to [facility] after being transferred from [facility] given no beds.  She complains of two day history of epigastric discomfort and on 2/18 it became worse and she began to vomit. She also complains of nausea, chest pain, SOB, left arm pain.  She does have a history of CAD/MI but these symptoms are different.  She noted about 2 weeks ago she was having an increase in her GERD symptoms and started taking tums more frequently.  She states 1 week ago she started to have anorexia, nausea, and a cough which she thinks contributed to her hiatal hernia getting larger.  She denies fevers, chills, dysuria. The patient currently lives on her own and her niece visits her daily.  She is very independent and mobile for her age.  Her workup showed marked enlargement of her massive hiatal hernia compared to her CXR in Nov 2008, decompression of the small bowel and colon concerning for possible gastric volvulus.  No family history on file.  Past Medical History  Diagnosis Date  . Coronary artery disease   . Hypercholesterolemia   . Hypertension   . Mitral insufficiency   . Aortic stenosis   . Hiatal hernia   . GERD (gastroesophageal reflux disease)   . History of stomach ulcers   . Chronic headaches   . Anxiety and depression     Past Surgical History  Procedure Laterality Date  . Abdominal hysterectomy      in her 30's  . Hip fracture surgery    . Shoulder surgery Right     Social History:  reports that she has never smoked. She does not have any smokeless tobacco history on file. She reports that she does not drink alcohol or use illicit drugs.  Allergies:  Allergies  Allergen Reactions  .  Allegra (Fexofenadine) Nausea And Vomiting  . Codeine Nausea And Vomiting  . Naproxen Other (See Comments)    unknown     (Not in a hospital admission)  Blood pressure 103/70, pulse 71, temperature 98.1 F (36.7 C), temperature source Oral, resp. rate 16, height 5\' 2"  (1.575 m), weight 140 lb (63.504 kg), SpO2 98.00%. Physical Exam: General: pleasant, WD/WN white female who is laying in bed in NAD HEENT: head is normocephalic, atraumatic.  Sclera are noninjected.  PERRL.  Ears and nose without any masses or lesions.  Mouth is pink and moist Heart: regular, rate, and rhythm.  Very prominent AS murmur.  Palpable pedal pulses bilaterally Lungs: CTAB, no wheezes, rhonchi, or rales noted.  Respiratory effort nonlabored Abd: soft, minimal tenderness in epigastrum, ND, +BS, no masses, hernias, or organomegaly MS: all 4 extremities are symmetrical with no cyanosis, low midline well healed scar, clubbing, or edema. Skin: warm and dry with no masses, lesions, or rashes Psych: A&Ox3 with an appropriate affect.    Results for orders placed during the hospital encounter of 07/18/12 (from the past 48 hour(s))  CBC WITH DIFFERENTIAL     Status: Abnormal   Collection Time    07/18/12  1:05 AM      Result Value Range   WBC 7.7  4.0 - 10.5 K/uL   RBC 3.89  3.87 - 5.11 MIL/uL  Hemoglobin 11.4 (*) 12.0 - 15.0 g/dL   HCT 16.1 (*) 09.6 - 04.5 %   MCV 90.2  78.0 - 100.0 fL   MCH 29.3  26.0 - 34.0 pg   MCHC 32.5  30.0 - 36.0 g/dL   RDW 40.9  81.1 - 91.4 %   Platelets 249  150 - 400 K/uL   Neutrophils Relative 75  43 - 77 %   Neutro Abs 5.8  1.7 - 7.7 K/uL   Lymphocytes Relative 17  12 - 46 %   Lymphs Abs 1.3  0.7 - 4.0 K/uL   Monocytes Relative 6  3 - 12 %   Monocytes Absolute 0.5  0.1 - 1.0 K/uL   Eosinophils Relative 2  0 - 5 %   Eosinophils Absolute 0.1  0.0 - 0.7 K/uL   Basophils Relative 0  0 - 1 %   Basophils Absolute 0.0  0.0 - 0.1 K/uL  COMPREHENSIVE METABOLIC PANEL     Status: Abnormal    Collection Time    07/18/12  1:05 AM      Result Value Range   Sodium 142  135 - 145 mEq/L   Potassium 3.2 (*) 3.5 - 5.1 mEq/L   Chloride 97  96 - 112 mEq/L   CO2 33 (*) 19 - 32 mEq/L   Glucose, Bld 120 (*) 70 - 99 mg/dL   BUN 23  6 - 23 mg/dL   Creatinine, Ser 7.82 (*) 0.50 - 1.10 mg/dL   Calcium 9.9  8.4 - 95.6 mg/dL   Total Protein 7.6  6.0 - 8.3 g/dL   Albumin 4.2  3.5 - 5.2 g/dL   AST 14  0 - 37 U/L   ALT 5  0 - 35 U/L   Alkaline Phosphatase 112  39 - 117 U/L   Total Bilirubin 0.5  0.3 - 1.2 mg/dL   GFR calc non Af Amer 35 (*) >90 mL/min   GFR calc Af Amer 40 (*) >90 mL/min   Comment:            The eGFR has been calculated     using the CKD EPI equation.     This calculation has not been     validated in all clinical     situations.     eGFR's persistently     <90 mL/min signify     possible Chronic Kidney Disease.  LIPASE, BLOOD     Status: None   Collection Time    07/18/12  1:05 AM      Result Value Range   Lipase 40  11 - 59 U/L  TROPONIN I     Status: None   Collection Time    07/18/12  1:05 AM      Result Value Range   Troponin I <0.30  <0.30 ng/mL   Comment:            Due to the release kinetics of cTnI,     a negative result within the first hours     of the onset of symptoms does not rule out     myocardial infarction with certainty.     If myocardial infarction is still suspected,     repeat the test at appropriate intervals.   Ct Chest W Contrast  07/18/2012  *RADIOLOGY REPORT*  Clinical Data:  Epigastric pain, chest pain.  History of hiatal hernia.  CT CHEST, ABDOMEN AND PELVIS WITH CONTRAST  Technique:  Multidetector CT imaging of the  chest, abdomen and pelvis was performed following the standard protocol during bolus administration of intravenous contrast.  Contrast: 1 OMNIPAQUE IOHEXOL 300 MG/ML  SOLN, 80mL OMNIPAQUE IOHEXOL 300 MG/ML  SOLN .  Comparison:  07/18/2012 radiograph  CT CHEST  Findings:  Enlarged thyroid with multiple nodules of  varying complexity.  Normal caliber aorta.  Heart displaced anteriorly by the large the hiatal hernia.  There is organoaxial rotation.  The distal stomach and small bowel loops are decompressed.  No lymphadenopathy.  Central airways are patent.  Mild peripheral reticular opacities. Mild bibasilar atelectasis and trace effusions. Mild areas of mosaic attenuation/atelectasis versus air trapping.  Osteopenia.  Right shoulder hardware.  Exaggerated kyphosis.  IMPRESSION: Markedly distended stomach, partially intrathoracic, with organoaxial rotation.  Given the distal decompression, gastric volvulus is of concern.  Complex left thyroid lobe nodules.  If clinically warranted, consider ultrasound.  CT ABDOMEN AND PELVIS  Findings:  A few hepatic hypodensities are nonspecific, favor cysts.  Unremarkable biliary system, spleen, adrenal glands.  Mild dilatation of the pancreatic duct within the head/neck.  In addition, there is dilatation of the side branch versus accessory duct along the head/uncinate process.  Unremarkable adrenal glands.  Bilateral renal cysts and incompletely characterized hypodensities. No hydronephrosis or hydroureter.  Colonic diverticulosis.  The colon and small bowel are essentially decompressed.  There is mild stranding of the small bowel root mesentery.  No lymphadenopathy.  Normal caliber aorta and branch vessels.  Left adnexal cyst has a nonaggressive appearance.  Thin-walled bladder.  Absent uterus.  Partially imaged right hip hardware.  Diffuse osteopenia and multilevel degenerative change.  T12 hemangioma.  Minimal anterolisthesis of L4 on L5.  IMPRESSION: Decompressed small bowel and colon. See chest report above.  Discussed via telephone with Dr. Judd Lien at 04:15 a.m. on 07/18/2012.   Original Report Authenticated By: Jearld Lesch, M.D.    Ct Abdomen Pelvis W Contrast  07/18/2012  *RADIOLOGY REPORT*  Clinical Data:  Epigastric pain, chest pain.  History of hiatal hernia.  CT CHEST,  ABDOMEN AND PELVIS WITH CONTRAST  Technique:  Multidetector CT imaging of the chest, abdomen and pelvis was performed following the standard protocol during bolus administration of intravenous contrast.  Contrast: 1 OMNIPAQUE IOHEXOL 300 MG/ML  SOLN, 80mL OMNIPAQUE IOHEXOL 300 MG/ML  SOLN .  Comparison:  07/18/2012 radiograph  CT CHEST  Findings:  Enlarged thyroid with multiple nodules of varying complexity.  Normal caliber aorta.  Heart displaced anteriorly by the large the hiatal hernia.  There is organoaxial rotation.  The distal stomach and small bowel loops are decompressed.  No lymphadenopathy.  Central airways are patent.  Mild peripheral reticular opacities. Mild bibasilar atelectasis and trace effusions. Mild areas of mosaic attenuation/atelectasis versus air trapping.  Osteopenia.  Right shoulder hardware.  Exaggerated kyphosis.  IMPRESSION: Markedly distended stomach, partially intrathoracic, with organoaxial rotation.  Given the distal decompression, gastric volvulus is of concern.  Complex left thyroid lobe nodules.  If clinically warranted, consider ultrasound.  CT ABDOMEN AND PELVIS  Findings:  A few hepatic hypodensities are nonspecific, favor cysts.  Unremarkable biliary system, spleen, adrenal glands.  Mild dilatation of the pancreatic duct within the head/neck.  In addition, there is dilatation of the side branch versus accessory duct along the head/uncinate process.  Unremarkable adrenal glands.  Bilateral renal cysts and incompletely characterized hypodensities. No hydronephrosis or hydroureter.  Colonic diverticulosis.  The colon and small bowel are essentially decompressed.  There is mild stranding of the small bowel root mesentery.  No lymphadenopathy.  Normal caliber aorta and branch vessels.  Left adnexal cyst has a nonaggressive appearance.  Thin-walled bladder.  Absent uterus.  Partially imaged right hip hardware.  Diffuse osteopenia and multilevel degenerative change.  T12 hemangioma.   Minimal anterolisthesis of L4 on L5.  IMPRESSION: Decompressed small bowel and colon. See chest report above.  Discussed via telephone with Dr. Judd Lien at 04:15 a.m. on 07/18/2012.   Original Report Authenticated By: Jearld Lesch, M.D.    Dg Chest Port 1 View  07/18/2012  *RADIOLOGY REPORT*  Clinical Data: Chest and epigastric pain  PORTABLE CHEST - 1 VIEW  Comparison: Prior chest x-ray 04/16/2007  Findings: Compared to the remote prior study, there is been a marked increase in the size of the hiatal hernia which now splays the carina.  The cardiopericardial silhouette is largely obscured by the large intrathoracic hiatal hernia.  Atherosclerotic calcifications noted in the transverse aorta.  Chronic central bronchitic changes and prominence of interstitial markings similar to prior.  No acute osseous abnormality.  Prior ORIF of a right humeral neck fracture.  IMPRESSION:  1.  Marked enlargement of massive hiatal hernia compared to the prior chest x-ray from November 2008. 2.  No definite acute cardiopulmonary disease although the large hiatal hernia obscures the majority of the thorax.   Original Report Authenticated By: Malachy Moan, M.D.        Assessment/Plan Hiatal Hernia - with recent increase in size CT concerning for gastric volvulus (dilated stomach with decompressed small bowel) Nausea/Vomiting Epigastric abdominal pain Aortic Stenosis and Mitral Insuff CAD, h/o MI and CHF HTN Hyperlipidemia GERD & h/o ulcers? Chronic Headaches Depression & Anxiety  1.  NPO, IVF, pain control, antiemetics, protonix 2.  Admit to medicine given chronic medical conditions, we will consult 3.  Cardiology may need to be consulted for pre-op (Cardiologist is Dr. Revonda Standard. in [place] [facility]) - saw him last January 29th 2014 4.  The patient may need surgery to repair the hiatal hernia and or a G-tube placement or a definitive Nissen at some point, but the  patient is hesitant to have surgery given her age and chronic medical conditions.  5.  Will treat conservative for now given improvement with NG, Dr. Magnus Ivan to evaluate later   DORT, [facility] 07/18/2012, 8:28 AM Pager: [phone]

## 2012-07-18 NOTE — ED Notes (Signed)
Assumed care of Pt at [phone]. EMS transferred pt from [facility] ED. Per EMS pt had 1.5 Canisters of fluid suctioned from NG tube at [facility].  Pt in NAD at this time, denies pain.

## 2012-07-18 NOTE — Consult Note (Signed)
  I have seen and examined [name] and agree with our PA's assessment and plan. Currently her abdomen is benign. I suspect she has had this for many years. Hopefully her NG can come out tomorrow and we can avoid surgery  If she does not improve, GI may need to perform endoscopy preop.

## 2012-07-18 NOTE — Progress Notes (Signed)
   CARE MANAGEMENT NOTE 07/18/2012  Patient:  [name], [name]   Account Number:  [id number]  Date Initiated:  07/18/2012  Documentation initiated by:  GRAVES-BIGELOW,Jaretzy Lhommedieu  Subjective/Objective Assessment:   Pt admitted as transfer from [facility] with abdominal pain and N/V. Plan for iv fluids and surgery to consult.     Action/Plan:   CM will continue to monitor for disposition needs.   Anticipated DC Date:  07/18/2012   Anticipated DC Plan:  HOME W HOME HEALTH SERVICES      DC Planning Services  CM consult      Choice offered to / List presented to:             Status of service:  In process, will continue to follow Medicare Important Message given?   (If response is "NO", the following Medicare IM given date fields will be blank) Date Medicare IM given:   Date Additional Medicare IM given:    Discharge Disposition:    Per UR Regulation:  Reviewed for med. necessity/level of care/duration of stay  If discussed at Long Length of Stay Meetings, dates discussed:    Comments:

## 2012-07-18 NOTE — ED Notes (Signed)
Pt reporting mild improvement in pain level.  Oxygen saturations 87% on room air.  Placed on 2 LPM oxygen, saturations improved to 98%.

## 2012-07-18 NOTE — ED Provider Notes (Signed)
Assuming care of patient this AM. Patient in the ED for Surgery evaluation - for hiatal hernia.  Surgery recs: 1. NPO, IVF, pain control, antiemetics, protonix  2. Admit to medicine given chronic medical conditions, we will consult  3. Cardiology may need to be consulted (Cardiologist is Dr. Revonda Standard. in [place] [facility]) - saw him last January 29th  4. The patient may need surgery to repair the hiatal hernia and or a G-tube placement or a definitive Nissen at some point, but the patient is hesitant to have surgery given her age and chronic medical conditions.  5. Will treat conservative for now given improvement with NG , Dr. Magnus Ivan to evaluate later   Patient had no complains, no concerns from the nursing side. Will continue to monitor. Medicine consulted for admission.  Derwood Kaplan, MD 07/18/12 [phone]

## 2012-07-18 NOTE — ED Notes (Signed)
PATIENT HAS ARRIVED IN POD C AND DR PATEL IN TO SEE HER

## 2012-07-18 NOTE — ED Notes (Signed)
PATIENT IS AWAKE AND ALERT. HER NG TUBE IS HOOKED TO NOW INTERMITTENT SUCTION DRAINING A TAN LIQUID. STATES SHE IS NOT NAUSEATED AT THIS TIME. FAMILY REPORTS THAT PT HAS BEEN VOMITING FOR A WEEK. PT REPORTS SHE HAS NOT BEEN ABLE TO KEEP ANYTHING DOWN. STATES HAVING PAIN IN HER CHEST AND IN HER LEFT NECK AND SHOULDER.

## 2012-07-18 NOTE — ED Notes (Signed)
Surgery at bedside seeing patient.

## 2012-07-18 NOTE — ED Notes (Signed)
REPORT CALLED TO 3W

## 2012-07-18 NOTE — ED Notes (Signed)
Patient transported to CT 

## 2012-07-18 NOTE — ED Notes (Signed)
Pain in epigastric area for a few days with vomiting.

## 2012-07-18 NOTE — Consult Note (Addendum)
I have seen and examined the patient and agree with the assessment and plans. She does have a right inguinal hernia containing bowel that is non obstructing according to the CT but I can not totally reduce it and it is tender. Even if GYN decides she is not an operative candidate, I can not rule out that the hernia will still need repair depending how she is doing clinically  Omran Keelin A. Magnus Ivan  MD, FACS  Disregard.  Wrong patient

## 2012-07-19 DIAGNOSIS — K311 Adult hypertrophic pyloric stenosis: Secondary | ICD-10-CM | POA: Diagnosis present

## 2012-07-19 MED ORDER — POTASSIUM CHLORIDE 10 MEQ/100ML IV SOLN: 10.0000 meq | INTRAVENOUS | Status: DC

## 2012-07-19 MED ORDER — TORSEMIDE 100 MG PO TABS
100.0000 mg | ORAL_TABLET | Freq: Every day | ORAL | Status: DC
  Administered 2012-07-20 – 2012-07-21 (×2): 100 mg via ORAL
  Filled 2012-07-19 (×2): qty 1

## 2012-07-19 MED ORDER — POTASSIUM CHLORIDE 10 MEQ/100ML IV SOLN
10.0000 meq | INTRAVENOUS | Status: AC
  Administered 2012-07-19 (×2): 10 meq via INTRAVENOUS
  Filled 2012-07-19 (×2): qty 100

## 2012-07-19 MED ORDER — POTASSIUM CHLORIDE CRYS ER 20 MEQ PO TBCR
40.0000 meq | EXTENDED_RELEASE_TABLET | Freq: Once | ORAL | Status: AC
  Administered 2012-07-19: 40 meq via ORAL
  Filled 2012-07-19: qty 2

## 2012-07-19 MED ORDER — TORSEMIDE 20 MG PO TABS
50.0000 mg | ORAL_TABLET | Freq: Once | ORAL | Status: AC
  Administered 2012-07-19: 50 mg via ORAL
  Filled 2012-07-19: qty 1

## 2012-07-19 NOTE — Progress Notes (Signed)
Pt tolerated clear liquid diet well, no c/o of n,v, or pian, will continue to monitor.

## 2012-07-19 NOTE — Progress Notes (Signed)
Subjective: Patient states that she has been doing well overnight. She denies having any further abdominal pain, she states that her pain resolved once they pulled fluid from the NG tube in the emergency room. She denies any nausea or vomiting. She is passing gas without difficulty. No bowel movements yet today.  Denies fever, chills, chest pain, shortness of breath, vomiting.  Objective: Vital signs in last 24 hours: Filed Vitals:   07/18/12 1149 07/18/12 1400 07/18/12 2100 07/19/12 0600  BP: 146/72 128/68 99/66 101/59  Pulse: 74 77 85 74  Temp: 98.3 F (36.8 C) 98.2 F (36.8 C) 99.6 F (37.6 C) 98.5 F (36.9 C)  TempSrc: Oral Oral    Resp: 16 16 18 15   Height: 5\' 3"  (1.6 m)     Weight: 135 lb 8 oz (61.462 kg)   135 lb 9.3 oz (61.5 kg)  SpO2: 100% 99%  99%   Weight change: -4 lb 8 oz (-2.041 kg)  Intake/Output Summary (Last 24 hours) at 07/19/12 1123 Last data filed at 07/18/12 1839  Gross per 24 hour  Intake      0 ml  Output    300 ml  Net   -300 ml    Physical Exam Blood pressure 101/59, pulse 74, temperature 98.5 F (36.9 C), temperature source Oral, resp. rate 15, height 5\' 3"  (1.6 m), weight 135 lb 9.3 oz (61.5 kg), SpO2 99.00%. General:  No acute distress, alert and oriented x 3, frail-appearing  HEENT:  PERRL, EOMI, moist mucous membranes, NG tube in place Cardiovascular:  Regular rate and rhythm, holosystolic murmur  Respiratory:  Clear to auscultation bilaterally, no wheezes, rales, or rhonchi Abdomen:  Soft, nondistended, nontender to palpation, bowel sounds present. Well-healed lower midline scar noted. Extremities:  Warm and well-perfused, no edema.  Skin: Warm, dry, no rashes Neuro: Not anxious appearing, no depressed mood, normal affect  Lab Results: Basic Metabolic Panel:  Recent Labs Lab 07/18/12 0105  NA 142  K 3.2*  CL 97  CO2 33*  GLUCOSE 120*  BUN 23  CREATININE 1.31*  CALCIUM 9.9   Liver Function Tests:  Recent Labs Lab  07/18/12 0105  AST 14  ALT 5  ALKPHOS 112  BILITOT 0.5  PROT 7.6  ALBUMIN 4.2    Recent Labs Lab 07/18/12 0105  LIPASE 40   CBC:  Recent Labs Lab 07/18/12 0105  WBC 7.7  NEUTROABS 5.8  HGB 11.4*  HCT 35.1*  MCV 90.2  PLT 249   Cardiac Enzymes:  Recent Labs Lab 07/18/12 0105  TROPONINI <0.30   Studies/Results: Ct Chest W Contrast  07/18/2012  **ADDENDUM** CREATED: 07/18/2012 21:53:41  Air fluid collection along the posterior margin of the duodenojejunal junction without surrounding inflammation is favored to reflect a prominent diverticulum.  **END ADDENDUM** SIGNED BY: Waneta Martins, M.D.   07/18/2012  *RADIOLOGY REPORT*  Clinical Data:  Epigastric pain, chest pain.  History of hiatal hernia.  CT CHEST, ABDOMEN AND PELVIS WITH CONTRAST  Technique:  Multidetector CT imaging of the chest, abdomen and pelvis was performed following the standard protocol during bolus administration of intravenous contrast.  Contrast: 1 OMNIPAQUE IOHEXOL 300 MG/ML  SOLN, 80mL OMNIPAQUE IOHEXOL 300 MG/ML  SOLN .  Comparison:  07/18/2012 radiograph  CT CHEST  Findings:  Enlarged thyroid with multiple nodules of varying complexity.  Normal caliber aorta.  Heart displaced anteriorly by the large the hiatal hernia.  There is organoaxial rotation.  The distal stomach and small bowel loops are  decompressed.  No lymphadenopathy.  Central airways are patent.  Mild peripheral reticular opacities. Mild bibasilar atelectasis and trace effusions. Mild areas of mosaic attenuation/atelectasis versus air trapping.  Osteopenia.  Right shoulder hardware.  Exaggerated kyphosis.  IMPRESSION: Markedly distended stomach, partially intrathoracic, with organoaxial rotation.  Given the distal decompression, gastric volvulus is of concern.  Complex left thyroid lobe nodules.  If clinically warranted, consider ultrasound.  CT ABDOMEN AND PELVIS  Findings:  A few hepatic hypodensities are nonspecific, favor cysts.   Unremarkable biliary system, spleen, adrenal glands.  Mild dilatation of the pancreatic duct within the head/neck.  In addition, there is dilatation of the side branch versus accessory duct along the head/uncinate process.  Unremarkable adrenal glands.  Bilateral renal cysts and incompletely characterized hypodensities. No hydronephrosis or hydroureter.  Colonic diverticulosis.  The colon and small bowel are essentially decompressed.  There is mild stranding of the small bowel root mesentery.  No lymphadenopathy.  Normal caliber aorta and branch vessels.  Left adnexal cyst has a nonaggressive appearance.  Thin-walled bladder.  Absent uterus.  Partially imaged right hip hardware.  Diffuse osteopenia and multilevel degenerative change.  T12 hemangioma.  Minimal anterolisthesis of L4 on L5.  IMPRESSION: Decompressed small bowel and colon. See chest report above.  Discussed via telephone with Dr. Judd Lien at 04:15 a.m. on 07/18/2012.  Original Report Authenticated By: Jearld Lesch, M.D.    Ct Abdomen Pelvis W Contrast  07/18/2012  **ADDENDUM** CREATED: 07/18/2012 21:53:41  Air fluid collection along the posterior margin of the duodenojejunal junction without surrounding inflammation is favored to reflect a prominent diverticulum.  **END ADDENDUM** SIGNED BY: Waneta Martins, M.D.   07/18/2012  *RADIOLOGY REPORT*  Clinical Data:  Epigastric pain, chest pain.  History of hiatal hernia.  CT CHEST, ABDOMEN AND PELVIS WITH CONTRAST  Technique:  Multidetector CT imaging of the chest, abdomen and pelvis was performed following the standard protocol during bolus administration of intravenous contrast.  Contrast: 1 OMNIPAQUE IOHEXOL 300 MG/ML  SOLN, 80mL OMNIPAQUE IOHEXOL 300 MG/ML  SOLN .  Comparison:  07/18/2012 radiograph  CT CHEST  Findings:  Enlarged thyroid with multiple nodules of varying complexity.  Normal caliber aorta.  Heart displaced anteriorly by the large the hiatal hernia.  There is organoaxial rotation.   The distal stomach and small bowel loops are decompressed.  No lymphadenopathy.  Central airways are patent.  Mild peripheral reticular opacities. Mild bibasilar atelectasis and trace effusions. Mild areas of mosaic attenuation/atelectasis versus air trapping.  Osteopenia.  Right shoulder hardware.  Exaggerated kyphosis.  IMPRESSION: Markedly distended stomach, partially intrathoracic, with organoaxial rotation.  Given the distal decompression, gastric volvulus is of concern.  Complex left thyroid lobe nodules.  If clinically warranted, consider ultrasound.  CT ABDOMEN AND PELVIS  Findings:  A few hepatic hypodensities are nonspecific, favor cysts.  Unremarkable biliary system, spleen, adrenal glands.  Mild dilatation of the pancreatic duct within the head/neck.  In addition, there is dilatation of the side branch versus accessory duct along the head/uncinate process.  Unremarkable adrenal glands.  Bilateral renal cysts and incompletely characterized hypodensities. No hydronephrosis or hydroureter.  Colonic diverticulosis.  The colon and small bowel are essentially decompressed.  There is mild stranding of the small bowel root mesentery.  No lymphadenopathy.  Normal caliber aorta and branch vessels.  Left adnexal cyst has a nonaggressive appearance.  Thin-walled bladder.  Absent uterus.  Partially imaged right hip hardware.  Diffuse osteopenia and multilevel degenerative change.  T12 hemangioma.  Minimal anterolisthesis of L4 on L5.  IMPRESSION: Decompressed small bowel and colon. See chest report above.  Discussed via telephone with Dr. Judd Lien at 04:15 a.m. on 07/18/2012.  Original Report Authenticated By: Jearld Lesch, M.D.    Dg Chest Port 1 View  07/18/2012  *RADIOLOGY REPORT*  Clinical Data: Evaluate nasogastric tube position  PORTABLE CHEST - 1 VIEW  Comparison: Recent CT chest abdomen pelvis 07/18/2012  Findings: Interval placement of a nasogastric tube.  Nasogastric tube is coiled in the mid thorax,  likely within the intrathoracic portion of the massive hiatal hernia.  Otherwise, unchanged appearance of the chest.  IMPRESSION: The nasogastric tube is coiled within the intrathoracic portion of the stomach.   Original Report Authenticated By: Malachy Moan, M.D.    Dg Chest Port 1 View  07/18/2012  *RADIOLOGY REPORT*  Clinical Data: Chest and epigastric pain  PORTABLE CHEST - 1 VIEW  Comparison: Prior chest x-ray 04/16/2007  Findings: Compared to the remote prior study, there is been a marked increase in the size of the hiatal hernia which now splays the carina.  The cardiopericardial silhouette is largely obscured by the large intrathoracic hiatal hernia.  Atherosclerotic calcifications noted in the transverse aorta.  Chronic central bronchitic changes and prominence of interstitial markings similar to prior.  No acute osseous abnormality.  Prior ORIF of a right humeral neck fracture.  IMPRESSION:  1.  Marked enlargement of massive hiatal hernia compared to the prior chest x-ray from November 2008. 2.  No definite acute cardiopulmonary disease although the large hiatal hernia obscures the majority of the thorax.   Original Report Authenticated By: Malachy Moan, M.D.    Dg Abd Portable 1v  07/18/2012  *RADIOLOGY REPORT*  Clinical Data: NG tube placement.  PORTABLE ABDOMEN - 1 VIEW  Comparison: Chest CT 07/18/2012.  Findings: The NG tube is barely visualized at the very top of the film.  It appears to be in the intrathoracic portion of the stomach.  IMPRESSION: NG tube appears to be in the intrathoracic portion of the stomach. A chest x-ray is recommended for confirmation.   Original Report Authenticated By: Rudie Meyer, M.D.    Medications:  Medications reviewed  Scheduled Meds: . heparin  5,000 Units Subcutaneous Q8H  . ondansetron (ZOFRAN) IV  4 mg Intravenous Q8H  . pantoprazole (PROTONIX) IV  40 mg Intravenous Q12H  . potassium chloride  10 mEq Intravenous Q1 Hr x 2   Continuous  Infusions: . 0.9 % NaCl with KCl 20 mEq / L 75 mL/hr at 07/18/12 2358   PRN Meds:.morphine injection  Assessment/Plan:  Patient is a pleasant 79 year old woman with past history significant for CAD, aortic stenosis, GERD, hiatal hernia and other medical problems as per problem list who came to [facility] last night with chief complaints of epigastric and chest pain for past one week along with new onset vomiting.  Large hiatal hernia with recent enlargement Patient presented with some abdominal pain, nausea, vomiting and imaging findings consistent with large thoracic hiatal hernia. With evidence of small bowel and colon decompression, there is concern for gastric outlet obstruction. Patient describes recent worsening GERD symptoms. Consider duodenal ulcer, although less likely.  2/20:Patient did well overnight with NG tube suction, anti-emetics. She states she is doing much better this morning, no pain  -Discontinue NG tube -Advance diet slowly. Start with clear liquid sips and monitor progress -cont protonix  CAD: Patient was history of CAD status post MI about 6 years ago, no  stent placement. She has chronic AS and mitral insufficiency - closely managed by Dr. Recardo Evangelist in [place].  - Will hold Lasix and Demadex for now. No signs of acute volume overload.   Hypertension: Blood pressure low on presentation. Continue gentle hydration.  - Will hold off any oral blood pressure medications for now.   Hypokalemia: Potassium 3.2. Patient on chronic home replacement. Likely from loop diuretics  - Replete in IV fluids  DVT prophylaxis: Subcutaneous heparin.   Ethics -pt is DNR  Dispo: Disposition is deferred at this time, awaiting improvement of current medical problems. Anticipated discharge in approximately 1-3 day(s).  The patient does have a current PCP (HAWKINS,EDWARD L, MD), therefore will not be requiring [facility]PC follow-up after discharge.  The patient does not have  transportation limitations that hinder transportation to clinic appointments.     LOS: 1 day   [place] Ar 07/19/2012, 11:23 AM

## 2012-07-19 NOTE — Progress Notes (Signed)
Subjective: Denies abdominal pain  Objective: Vital signs in last 24 hours: Temp:  [98.2 F (36.8 C)-99.6 F (37.6 C)] 98.5 F (36.9 C) (02/20 0600) Pulse Rate:  [69-85] 74 (02/20 0600) Resp:  [14-20] 15 (02/20 0600) BP: (99-146)/(43-72) 101/59 mmHg (02/20 0600) SpO2:  [99 %-100 %] 99 % (02/20 0600) Weight:  [135 lb 8 oz (61.462 kg)-135 lb 9.3 oz (61.5 kg)] 135 lb 9.3 oz (61.5 kg) (02/20 0600) Last BM Date: 07/18/12  Intake/Output from previous day: 02/19 0701 - 02/20 0700 In: 0  Out: 300 [Urine:300] Intake/Output this shift:    Looks good Abdomin soft, non tender, non distended  Lab Results:   Recent Labs  07/18/12 0105  WBC 7.7  HGB 11.4*  HCT 35.1*  PLT 249   BMET  Recent Labs  07/18/12 0105  NA 142  K 3.2*  CL 97  CO2 33*  GLUCOSE 120*  BUN 23  CREATININE 1.31*  CALCIUM 9.9   PT/INR No results found for this basename: LABPROT, INR,  in the last 72 hours ABG No results found for this basename: PHART, PCO2, PO2, HCO3,  in the last 72 hours  Studies/Results: Ct Chest W Contrast  07/18/2012  **ADDENDUM** CREATED: 07/18/2012 21:53:41  Air fluid collection along the posterior margin of the duodenojejunal junction without surrounding inflammation is favored to reflect a prominent diverticulum.  **END ADDENDUM** SIGNED BY: Waneta Martins, M.D.   07/18/2012  *RADIOLOGY REPORT*  Clinical Data:  Epigastric pain, chest pain.  History of hiatal hernia.  CT CHEST, ABDOMEN AND PELVIS WITH CONTRAST  Technique:  Multidetector CT imaging of the chest, abdomen and pelvis was performed following the standard protocol during bolus administration of intravenous contrast.  Contrast: 1 OMNIPAQUE IOHEXOL 300 MG/ML  SOLN, 80mL OMNIPAQUE IOHEXOL 300 MG/ML  SOLN .  Comparison:  07/18/2012 radiograph  CT CHEST  Findings:  Enlarged thyroid with multiple nodules of varying complexity.  Normal caliber aorta.  Heart displaced anteriorly by the large the hiatal hernia.  There is  organoaxial rotation.  The distal stomach and small bowel loops are decompressed.  No lymphadenopathy.  Central airways are patent.  Mild peripheral reticular opacities. Mild bibasilar atelectasis and trace effusions. Mild areas of mosaic attenuation/atelectasis versus air trapping.  Osteopenia.  Right shoulder hardware.  Exaggerated kyphosis.  IMPRESSION: Markedly distended stomach, partially intrathoracic, with organoaxial rotation.  Given the distal decompression, gastric volvulus is of concern.  Complex left thyroid lobe nodules.  If clinically warranted, consider ultrasound.  CT ABDOMEN AND PELVIS  Findings:  A few hepatic hypodensities are nonspecific, favor cysts.  Unremarkable biliary system, spleen, adrenal glands.  Mild dilatation of the pancreatic duct within the head/neck.  In addition, there is dilatation of the side branch versus accessory duct along the head/uncinate process.  Unremarkable adrenal glands.  Bilateral renal cysts and incompletely characterized hypodensities. No hydronephrosis or hydroureter.  Colonic diverticulosis.  The colon and small bowel are essentially decompressed.  There is mild stranding of the small bowel root mesentery.  No lymphadenopathy.  Normal caliber aorta and branch vessels.  Left adnexal cyst has a nonaggressive appearance.  Thin-walled bladder.  Absent uterus.  Partially imaged right hip hardware.  Diffuse osteopenia and multilevel degenerative change.  T12 hemangioma.  Minimal anterolisthesis of L4 on L5.  IMPRESSION: Decompressed small bowel and colon. See chest report above.  Discussed via telephone with Dr. Judd Lien at 04:15 a.m. on 07/18/2012.  Original Report Authenticated By: Jearld Lesch, M.D.  Ct Abdomen Pelvis W Contrast  07/18/2012  **ADDENDUM** CREATED: 07/18/2012 21:53:41  Air fluid collection along the posterior margin of the duodenojejunal junction without surrounding inflammation is favored to reflect a prominent diverticulum.  **END ADDENDUM**  SIGNED BY: Waneta Martins, M.D.   07/18/2012  *RADIOLOGY REPORT*  Clinical Data:  Epigastric pain, chest pain.  History of hiatal hernia.  CT CHEST, ABDOMEN AND PELVIS WITH CONTRAST  Technique:  Multidetector CT imaging of the chest, abdomen and pelvis was performed following the standard protocol during bolus administration of intravenous contrast.  Contrast: 1 OMNIPAQUE IOHEXOL 300 MG/ML  SOLN, 80mL OMNIPAQUE IOHEXOL 300 MG/ML  SOLN .  Comparison:  07/18/2012 radiograph  CT CHEST  Findings:  Enlarged thyroid with multiple nodules of varying complexity.  Normal caliber aorta.  Heart displaced anteriorly by the large the hiatal hernia.  There is organoaxial rotation.  The distal stomach and small bowel loops are decompressed.  No lymphadenopathy.  Central airways are patent.  Mild peripheral reticular opacities. Mild bibasilar atelectasis and trace effusions. Mild areas of mosaic attenuation/atelectasis versus air trapping.  Osteopenia.  Right shoulder hardware.  Exaggerated kyphosis.  IMPRESSION: Markedly distended stomach, partially intrathoracic, with organoaxial rotation.  Given the distal decompression, gastric volvulus is of concern.  Complex left thyroid lobe nodules.  If clinically warranted, consider ultrasound.  CT ABDOMEN AND PELVIS  Findings:  A few hepatic hypodensities are nonspecific, favor cysts.  Unremarkable biliary system, spleen, adrenal glands.  Mild dilatation of the pancreatic duct within the head/neck.  In addition, there is dilatation of the side branch versus accessory duct along the head/uncinate process.  Unremarkable adrenal glands.  Bilateral renal cysts and incompletely characterized hypodensities. No hydronephrosis or hydroureter.  Colonic diverticulosis.  The colon and small bowel are essentially decompressed.  There is mild stranding of the small bowel root mesentery.  No lymphadenopathy.  Normal caliber aorta and branch vessels.  Left adnexal cyst has a nonaggressive  appearance.  Thin-walled bladder.  Absent uterus.  Partially imaged right hip hardware.  Diffuse osteopenia and multilevel degenerative change.  T12 hemangioma.  Minimal anterolisthesis of L4 on L5.  IMPRESSION: Decompressed small bowel and colon. See chest report above.  Discussed via telephone with Dr. Judd Lien at 04:15 a.m. on 07/18/2012.  Original Report Authenticated By: Jearld Lesch, M.D.    Dg Chest Port 1 View  07/18/2012  *RADIOLOGY REPORT*  Clinical Data: Evaluate nasogastric tube position  PORTABLE CHEST - 1 VIEW  Comparison: Recent CT chest abdomen pelvis 07/18/2012  Findings: Interval placement of a nasogastric tube.  Nasogastric tube is coiled in the mid thorax, likely within the intrathoracic portion of the massive hiatal hernia.  Otherwise, unchanged appearance of the chest.  IMPRESSION: The nasogastric tube is coiled within the intrathoracic portion of the stomach.   Original Report Authenticated By: Malachy Moan, M.D.    Dg Chest Port 1 View  07/18/2012  *RADIOLOGY REPORT*  Clinical Data: Chest and epigastric pain  PORTABLE CHEST - 1 VIEW  Comparison: Prior chest x-ray 04/16/2007  Findings: Compared to the remote prior study, there is been a marked increase in the size of the hiatal hernia which now splays the carina.  The cardiopericardial silhouette is largely obscured by the large intrathoracic hiatal hernia.  Atherosclerotic calcifications noted in the transverse aorta.  Chronic central bronchitic changes and prominence of interstitial markings similar to prior.  No acute osseous abnormality.  Prior ORIF of a right humeral neck fracture.  IMPRESSION:  1.  Marked enlargement of massive hiatal hernia compared to the prior chest x-ray from November 2008. 2.  No definite acute cardiopulmonary disease although the large hiatal hernia obscures the majority of the thorax.   Original Report Authenticated By: Malachy Moan, M.D.    Dg Abd Portable 1v  07/18/2012  *RADIOLOGY REPORT*   Clinical Data: NG tube placement.  PORTABLE ABDOMEN - 1 VIEW  Comparison: Chest CT 07/18/2012.  Findings: The NG tube is barely visualized at the very top of the film.  It appears to be in the intrathoracic portion of the stomach.  IMPRESSION: NG tube appears to be in the intrathoracic portion of the stomach. A chest x-ray is recommended for confirmation.   Original Report Authenticated By: Rudie Meyer, M.D.     Anti-infectives: Anti-infectives   None      Assessment/Plan: s/p * No surgery found *  Abdominal pain, hiatal hernia  Will d/c NG and let her have sips.  If pain recurs, will have to work up further and consider surgery  LOS: 1 day    Dywane Peruski A 07/19/2012

## 2012-07-19 NOTE — H&P (Signed)
Internal Medicine Attending Admission Note Date: 07/19/2012  Patient name: [name] Medical record number: [id number] Date of birth: 06/26/1919 Age: 79 y.o. Gender: female  I saw and evaluated the patient. I reviewed the resident's note and I agree with the resident's findings and plan as documented in the resident's note.  Chief Complaint(s): Abdominal pain x 1 week.  History - key components related to admission:  [name] is a 79 year old woman with a history of coronary artery disease, aortic stenosis, large hiatal hernia, and gastroesophageal reflux disease who presents with a one-week history of upper abdominal pain. This was preceded by a subjective worsening in her gastroesophageal reflux disease. On the night of admission she developed vomiting which was new and therefore presented to the emergency department for further evaluation. There she was found to have an enlarged hiatal hernia with a distended stomach but a decompressed small and large bowel. She was therefore admitted to the internal medicine teaching service for further evaluation and therapy. She denies any fevers, shakes, or chills.  In the emergency department an NG tube was placed and she received antibiotics and pain medication. Her symptoms improved. The NG tube was subsequently discontinued and she was started on clear liquids which she has tolerated so far today. She is without other complaints and states her abdominal pain is markedly improved.  Physical Exam - key components related to admission:  Filed Vitals:   07/18/12 1400 07/18/12 2100 07/19/12 0600 07/19/12 1306  BP: 128/68 99/66 101/59 103/50  Pulse: 77 85 74 78  Temp: 98.2 F (36.8 C) 99.6 F (37.6 C) 98.5 F (36.9 C) 98.7 F (37.1 C)  TempSrc: Oral   Oral  Resp: 16 18 15 16   Height:      Weight:   135 lb 9.3 oz (61.5 kg)   SpO2: 99%  99% 98%   General: Well-developed, well-nourished, woman lying comfortably in bed in no acute distress. She  appears 14 years younger than her stated age. Lungs: Clear to auscultation bilaterally without wheezes, rhonchi, or rales. Heart: Regular rate and rhythm with a 2/6 harsh systolic murmur at the left upper sternal border without rubs or gallops. Abdomen: Soft, nontender, active bowel sounds. Extremities: Without edema.  Lab results:  Basic Metabolic Panel:  Recent Labs  40/98/11 0105  NA 142  K 3.2*  CL 97  CO2 33*  GLUCOSE 120*  BUN 23  CREATININE 1.31*  CALCIUM 9.9   Liver Function Tests:  Recent Labs  07/18/12 0105  AST 14  ALT 5  ALKPHOS 112  BILITOT 0.5  PROT 7.6  ALBUMIN 4.2    Recent Labs  07/18/12 0105  LIPASE 40   CBC:  Recent Labs  07/18/12 0105  WBC 7.7  NEUTROABS 5.8  HGB 11.4*  HCT 35.1*  MCV 90.2  PLT 249   Cardiac Enzymes:  Recent Labs  07/18/12 0105  TROPONINI <0.30   Imaging results:  Ct Chest W Contrast  07/18/2012  **ADDENDUM** CREATED: 07/18/2012 21:53:41  Air fluid collection along the posterior margin of the duodenojejunal junction without surrounding inflammation is favored to reflect a prominent diverticulum.  **END ADDENDUM** SIGNED BY: Waneta Martins, M.D.   07/18/2012  *RADIOLOGY REPORT*  Clinical Data:  Epigastric pain, chest pain.  History of hiatal hernia.  CT CHEST, ABDOMEN AND PELVIS WITH CONTRAST  Technique:  Multidetector CT imaging of the chest, abdomen and pelvis was performed following the standard protocol during bolus administration of intravenous contrast.  Contrast:  1 OMNIPAQUE IOHEXOL 300 MG/ML  SOLN, 80mL OMNIPAQUE IOHEXOL 300 MG/ML  SOLN .  Comparison:  07/18/2012 radiograph  CT CHEST  Findings:  Enlarged thyroid with multiple nodules of varying complexity.  Normal caliber aorta.  Heart displaced anteriorly by the large the hiatal hernia.  There is organoaxial rotation.  The distal stomach and small bowel loops are decompressed.  No lymphadenopathy.  Central airways are patent.  Mild peripheral reticular  opacities. Mild bibasilar atelectasis and trace effusions. Mild areas of mosaic attenuation/atelectasis versus air trapping.  Osteopenia.  Right shoulder hardware.  Exaggerated kyphosis.  IMPRESSION: Markedly distended stomach, partially intrathoracic, with organoaxial rotation.  Given the distal decompression, gastric volvulus is of concern.  Complex left thyroid lobe nodules.  If clinically warranted, consider ultrasound.  CT ABDOMEN AND PELVIS  Findings:  A few hepatic hypodensities are nonspecific, favor cysts.  Unremarkable biliary system, spleen, adrenal glands.  Mild dilatation of the pancreatic duct within the head/neck.  In addition, there is dilatation of the side branch versus accessory duct along the head/uncinate process.  Unremarkable adrenal glands.  Bilateral renal cysts and incompletely characterized hypodensities. No hydronephrosis or hydroureter.  Colonic diverticulosis.  The colon and small bowel are essentially decompressed.  There is mild stranding of the small bowel root mesentery.  No lymphadenopathy.  Normal caliber aorta and branch vessels.  Left adnexal cyst has a nonaggressive appearance.  Thin-walled bladder.  Absent uterus.  Partially imaged right hip hardware.  Diffuse osteopenia and multilevel degenerative change.  T12 hemangioma.  Minimal anterolisthesis of L4 on L5.  IMPRESSION: Decompressed small bowel and colon. See chest report above.  Discussed via telephone with Dr. Judd Lien at 04:15 a.m. on 07/18/2012.  Original Report Authenticated By: Jearld Lesch, M.D.    Ct Abdomen Pelvis W Contrast  07/18/2012  **ADDENDUM** CREATED: 07/18/2012 21:53:41  Air fluid collection along the posterior margin of the duodenojejunal junction without surrounding inflammation is favored to reflect a prominent diverticulum.  **END ADDENDUM** SIGNED BY: Waneta Martins, M.D.   07/18/2012  *RADIOLOGY REPORT*  Clinical Data:  Epigastric pain, chest pain.  History of hiatal hernia.  CT CHEST, ABDOMEN  AND PELVIS WITH CONTRAST  Technique:  Multidetector CT imaging of the chest, abdomen and pelvis was performed following the standard protocol during bolus administration of intravenous contrast.  Contrast: 1 OMNIPAQUE IOHEXOL 300 MG/ML  SOLN, 80mL OMNIPAQUE IOHEXOL 300 MG/ML  SOLN .  Comparison:  07/18/2012 radiograph  CT CHEST  Findings:  Enlarged thyroid with multiple nodules of varying complexity.  Normal caliber aorta.  Heart displaced anteriorly by the large the hiatal hernia.  There is organoaxial rotation.  The distal stomach and small bowel loops are decompressed.  No lymphadenopathy.  Central airways are patent.  Mild peripheral reticular opacities. Mild bibasilar atelectasis and trace effusions. Mild areas of mosaic attenuation/atelectasis versus air trapping.  Osteopenia.  Right shoulder hardware.  Exaggerated kyphosis.  IMPRESSION: Markedly distended stomach, partially intrathoracic, with organoaxial rotation.  Given the distal decompression, gastric volvulus is of concern.  Complex left thyroid lobe nodules.  If clinically warranted, consider ultrasound.  CT ABDOMEN AND PELVIS  Findings:  A few hepatic hypodensities are nonspecific, favor cysts.  Unremarkable biliary system, spleen, adrenal glands.  Mild dilatation of the pancreatic duct within the head/neck.  In addition, there is dilatation of the side branch versus accessory duct along the head/uncinate process.  Unremarkable adrenal glands.  Bilateral renal cysts and incompletely characterized hypodensities. No hydronephrosis or hydroureter.  Colonic  diverticulosis.  The colon and small bowel are essentially decompressed.  There is mild stranding of the small bowel root mesentery.  No lymphadenopathy.  Normal caliber aorta and branch vessels.  Left adnexal cyst has a nonaggressive appearance.  Thin-walled bladder.  Absent uterus.  Partially imaged right hip hardware.  Diffuse osteopenia and multilevel degenerative change.  T12 hemangioma.  Minimal  anterolisthesis of L4 on L5.  IMPRESSION: Decompressed small bowel and colon. See chest report above.  Discussed via telephone with Dr. Judd Lien at 04:15 a.m. on 07/18/2012.  Original Report Authenticated By: Jearld Lesch, M.D.    Dg Chest Port 1 View  07/18/2012  *RADIOLOGY REPORT*  Clinical Data: Evaluate nasogastric tube position  PORTABLE CHEST - 1 VIEW  Comparison: Recent CT chest abdomen pelvis 07/18/2012  Findings: Interval placement of a nasogastric tube.  Nasogastric tube is coiled in the mid thorax, likely within the intrathoracic portion of the massive hiatal hernia.  Otherwise, unchanged appearance of the chest.  IMPRESSION: The nasogastric tube is coiled within the intrathoracic portion of the stomach.   Original Report Authenticated By: Malachy Moan, M.D.    Dg Chest Port 1 View  07/18/2012  *RADIOLOGY REPORT*  Clinical Data: Chest and epigastric pain  PORTABLE CHEST - 1 VIEW  Comparison: Prior chest x-ray 04/16/2007  Findings: Compared to the remote prior study, there is been a marked increase in the size of the hiatal hernia which now splays the carina.  The cardiopericardial silhouette is largely obscured by the large intrathoracic hiatal hernia.  Atherosclerotic calcifications noted in the transverse aorta.  Chronic central bronchitic changes and prominence of interstitial markings similar to prior.  No acute osseous abnormality.  Prior ORIF of a right humeral neck fracture.  IMPRESSION:  1.  Marked enlargement of massive hiatal hernia compared to the prior chest x-ray from November 2008. 2.  No definite acute cardiopulmonary disease although the large hiatal hernia obscures the majority of the thorax.   Original Report Authenticated By: Malachy Moan, M.D.    Dg Abd Portable 1v  07/18/2012  *RADIOLOGY REPORT*  Clinical Data: NG tube placement.  PORTABLE ABDOMEN - 1 VIEW  Comparison: Chest CT 07/18/2012.  Findings: The NG tube is barely visualized at the very top of the film.  It  appears to be in the intrathoracic portion of the stomach.  IMPRESSION: NG tube appears to be in the intrathoracic portion of the stomach. A chest x-ray is recommended for confirmation.   Original Report Authenticated By: Rudie Meyer, M.D.    Assessment & Plan by Problem:  Ms. Mcgourty is a 79 year old woman with a large hiatal hernia who presents with increasing abdominal pain x1 week and vomiting x1. She was found to have an enlarging hiatal hernia with a distended stomach and a decompressed small bowel and large bowel. This pattern is concerning for a gastric outlet obstruction. She symptomatically improved after NG tube decompression and is now tolerating clear liquids.  1) Presumed gastric outlet obstruction: Resolving with NG tube decompression and PPI therapy. We will advance her diet as tolerated. If her symptoms resolve, no further evaluation or therapy is required and she is not interested in any surgery at her advanced age.  2) Disposition: Ms. Pressly will be discharged home when she is tolerating a full diet.

## 2012-07-20 ENCOUNTER — Encounter (HOSPITAL_COMMUNITY): Payer: Self-pay | Admitting: *Deleted

## 2012-07-20 LAB — BASIC METABOLIC PANEL
CO2: 33 mEq/L — ABNORMAL HIGH (ref 19–32)
Chloride: 106 mEq/L (ref 96–112)
Glucose, Bld: 91 mg/dL (ref 70–99)
Sodium: 143 mEq/L (ref 135–145)

## 2012-07-20 MED ORDER — ASPIRIN EC 81 MG PO TBEC
81.0000 mg | DELAYED_RELEASE_TABLET | Freq: Every day | ORAL | Status: DC
  Administered 2012-07-20 – 2012-07-21 (×2): 81 mg via ORAL
  Filled 2012-07-20 (×2): qty 1

## 2012-07-20 MED ORDER — ESCITALOPRAM OXALATE 20 MG PO TABS
20.0000 mg | ORAL_TABLET | Freq: Every day | ORAL | Status: DC
  Administered 2012-07-20 – 2012-07-21 (×2): 20 mg via ORAL
  Filled 2012-07-20 (×2): qty 1

## 2012-07-20 MED ORDER — BISACODYL 10 MG RE SUPP
10.0000 mg | Freq: Every day | RECTAL | Status: DC | PRN
  Administered 2012-07-21: 10 mg via RECTAL
  Filled 2012-07-20: qty 1

## 2012-07-20 MED ORDER — FLEET ENEMA 7-19 GM/118ML RE ENEM: 1.0000 | ENEMA | Freq: Once | RECTAL | Status: DC

## 2012-07-20 MED ORDER — DOCUSATE SODIUM 100 MG PO CAPS
100.0000 mg | ORAL_CAPSULE | Freq: Every day | ORAL | Status: DC
  Administered 2012-07-20 – 2012-07-21 (×2): 100 mg via ORAL
  Filled 2012-07-20 (×2): qty 1

## 2012-07-20 MED ORDER — SIMVASTATIN 40 MG PO TABS
40.0000 mg | ORAL_TABLET | Freq: Every evening | ORAL | Status: DC
  Administered 2012-07-20 – 2012-07-21 (×2): 40 mg via ORAL
  Filled 2012-07-20 (×2): qty 1

## 2012-07-20 MED ORDER — PANTOPRAZOLE SODIUM 40 MG PO TBEC
40.0000 mg | DELAYED_RELEASE_TABLET | Freq: Every day | ORAL | Status: DC
  Administered 2012-07-20 – 2012-07-21 (×2): 40 mg via ORAL
  Filled 2012-07-20 (×2): qty 1

## 2012-07-20 MED ORDER — ASPIRIN 81 MG PO TABS: 81.0000 mg | ORAL_TABLET | Freq: Every day | ORAL | Status: DC

## 2012-07-20 MED ORDER — CARVEDILOL 3.125 MG PO TABS
3.1250 mg | ORAL_TABLET | Freq: Two times a day (BID) | ORAL | Status: DC
  Administered 2012-07-20 – 2012-07-21 (×4): 3.125 mg via ORAL
  Filled 2012-07-20 (×5): qty 1

## 2012-07-20 MED ORDER — ZOLPIDEM TARTRATE 5 MG PO TABS
5.0000 mg | ORAL_TABLET | Freq: Once | ORAL | Status: AC
  Administered 2012-07-20: 5 mg via ORAL
  Filled 2012-07-20: qty 1

## 2012-07-20 NOTE — Progress Notes (Signed)
Patient ID: [name], female   DOB: November 15, 1919, 79 y.o.   MRN: [id number]  She continues to deny abdominal pain.  Tolerating liquids and wants to try regular food.  She complains of weakness and is worried about going home. Abdomen is soft and non tender on exam.  Will allow a regular diet.

## 2012-07-20 NOTE — Progress Notes (Signed)
OT NOTE:    SATURATION QUALIFICATIONS: (This note is used to comply with regulatory documentation for home oxygen)  Patient Saturations on Room Air at Rest = 91%  Patient Saturations on Room Air while Ambulating = 90%  Patient Saturations on 1 Liters of oxygen while Ambulating = 91%  Please briefly explain why patient needs home oxygen: Pt demonstrates 2 out 4 dyspnea.   [name]   OTR/L Pager: [phone] Office: [phone] .

## 2012-07-20 NOTE — Progress Notes (Signed)
Subjective: [name] is doing well this morning. She tolerated liquid diet yesterday without nausea or vomiting and had ice cream for breakfast. She has not been out of bed much.  Denies fever, chills, chest pain, shortness of breath, vomiting.   Objective: Vital signs in last 24 hours: Filed Vitals:   07/19/12 1306 07/19/12 2100 07/20/12 0618 07/20/12 1002  BP: 103/50 104/58 115/69 124/52  Pulse: 78 73 73 80  Temp: 98.7 F (37.1 C) 98.3 F (36.8 C) 98.7 F (37.1 C)   TempSrc: Oral Oral Oral   Resp: 16 17 16    Height:      Weight:   133 lb 11.2 oz (60.646 kg)   SpO2: 98% 96% 98%    Weight change: -1 lb 12.8 oz (-0.816 kg)  Intake/Output Summary (Last 24 hours) at 07/20/12 1139 Last data filed at 07/20/12 0600  Gross per 24 hour  Intake      0 ml  Output   1550 ml  Net  -1550 ml    Physical Exam Blood pressure 124/52, pulse 80, temperature 98.7 F (37.1 C), temperature source Oral, resp. rate 16, height 5\' 3"  (1.6 m), weight 133 lb 11.2 oz (60.646 kg), SpO2 98.00%. General:  No acute distress, alert and oriented x 3 HEENT:  PERRL, EOMI, moist mucous membranes Cardiovascular:  Regular rate and rhythm, holosystolic murmur  Respiratory:  Mild rales in bases, otherwise clear no wheezes or rhonchi Abdomen:  Soft, nondistended, nontender to palpation, bowel sounds present. Well-healed lower midline scar noted. Extremities:  Warm and well-perfused, no edema.  Skin: Warm, dry, no rashes Neuro: Not anxious appearing, no depressed mood, normal affect  Lab Results: Basic Metabolic Panel:  Recent Labs Lab 07/18/12 0105 07/20/12 0558  NA 142 143  K 3.2* 4.2  CL 97 106  CO2 33* 33*  GLUCOSE 120* 91  BUN 23 16  CREATININE 1.31* 1.20*  CALCIUM 9.9 8.6   Liver Function Tests:  Recent Labs Lab 07/18/12 0105  AST 14  ALT 5  ALKPHOS 112  BILITOT 0.5  PROT 7.6  ALBUMIN 4.2    Recent Labs Lab 07/18/12 0105  LIPASE 40   CBC:  Recent Labs Lab 07/18/12 0105   WBC 7.7  NEUTROABS 5.8  HGB 11.4*  HCT 35.1*  MCV 90.2  PLT 249   Cardiac Enzymes:  Recent Labs Lab 07/18/12 0105  TROPONINI <0.30   Studies/Results: Dg Chest Port 1 View  07/18/2012  *RADIOLOGY REPORT*  Clinical Data: Evaluate nasogastric tube position  PORTABLE CHEST - 1 VIEW  Comparison: Recent CT chest abdomen pelvis 07/18/2012  Findings: Interval placement of a nasogastric tube.  Nasogastric tube is coiled in the mid thorax, likely within the intrathoracic portion of the massive hiatal hernia.  Otherwise, unchanged appearance of the chest.  IMPRESSION: The nasogastric tube is coiled within the intrathoracic portion of the stomach.   Original Report Authenticated By: Malachy Moan, M.D.    Dg Abd Portable 1v  07/18/2012  *RADIOLOGY REPORT*  Clinical Data: NG tube placement.  PORTABLE ABDOMEN - 1 VIEW  Comparison: Chest CT 07/18/2012.  Findings: The NG tube is barely visualized at the very top of the film.  It appears to be in the intrathoracic portion of the stomach.  IMPRESSION: NG tube appears to be in the intrathoracic portion of the stomach. A chest x-ray is recommended for confirmation.   Original Report Authenticated By: Rudie Meyer, M.D.    Medications:  Medications reviewed  Scheduled Meds: . aspirin  EC  81 mg Oral Daily  . carvedilol  3.125 mg Oral BID WC  . docusate sodium  100 mg Oral Daily  . escitalopram  20 mg Oral Daily  . heparin  5,000 Units Subcutaneous Q8H  . pantoprazole  40 mg Oral Q1200  . simvastatin  40 mg Oral QPM  . torsemide  100 mg Oral Daily   Continuous Infusions:   PRN Meds:.  Assessment/Plan:  Patient is a pleasant 79 year old woman with past history significant for CAD, aortic stenosis, GERD, hiatal hernia and other medical problems as per problem list who came to [facility] last night with chief complaints of epigastric and chest pain for past one week along with new onset vomiting.  Presumed Gastric Outlet Obstruction:  resolving Patient with large hiatal hernia with recent enlargement and bowel decompression concerning for GOO.  Patient presented with some abdominal pain, nausea, vomiting and imaging findings of large thoracic hiatal hernia with recent enlargement in size. Patient describes recent worsening GERD symptoms. Consider duodenal ulcer, although less likely.  2/20:Patient did well overnight with NG tube suction, anti-emetics. She states she is doing much better this morning, no pain 2/21: doing well, pt passing gas, no nausea or vomiting   -advance diet  -switch to PO protonix -suppository for BM  CAD with AS and Mitral insufficiency: Patient was history of CAD status post MI about 6 years ago, no stent placement. She has chronic AS and mitral insufficiency - managed by Dr. Recardo Evangelist in [place]. Discussed with Dr. Tiburcio Pea and patient is not supposed to be taking both lasix and torsemide. She has follow up with him in May. 2/21: patient with mild crackles on exam, likely from IVFs.   -cont home torsemide -add back PO home meds  Hypertension: Blood pressure low on presentation. -back to PO home meds today  Hypokalemia: Potassium 3.2 on admission, repleted in IVFs. Patient on chronic home replacement. Likely from loop diuretics  K 4.2 today  DVT prophylaxis: Subcutaneous heparin.   Ethics -pt is DNR  Dispo:  Patient lives by herself with help from niece Susie with medications. Will need PT evaluation before DC.  The patient does have a current PCP (HAWKINS,EDWARD L, MD), therefore will not be requiring OPC follow-up after discharge.  The patient does not have transportation limitations that hinder transportation to clinic appointments.     LOS: 2 days   [place] Ar 07/20/2012, 11:39 AM

## 2012-07-20 NOTE — Evaluation (Signed)
Physical Therapy Evaluation Patient Details Name: [name] MRN: [id number] DOB: 04-Apr-1920 Today's Date: 07/20/2012 Time: 4098-1191 PT Time Calculation (min): 46 min  PT Assessment / Plan / Recommendation Clinical Impression  pt admitted with a 1 week h/o abdominal pain with n/v.  Determined to likely be caused by gastric outlet dysfunction.  On eval, pt is weak with gait unsteadiness and has not yet eat solids.  Pt can benefit from PT to maximize function.  Pt wishes to go home.  She would be safest if she could have her niece stay with her 1-3 days until she recovers more.  Extensive discussion with pt and niece by telephone on what I felt was best under the present circumstances.  The niece did not commit to up to 24 hour /day for 1-3 days.    PT Assessment  Patient needs continued PT services    Follow Up Recommendations  Home health PT;Supervision for mobility/OOB;Supervision - Intermittent    Does the patient have the potential to tolerate intense rehabilitation      Barriers to Discharge Decreased caregiver support      Equipment Recommendations  None recommended by PT    Recommendations for Other Services     Frequency Min 3X/week    Precautions / Restrictions Precautions Precautions: Fall Restrictions Weight Bearing Restrictions: No   Pertinent Vitals/Pain Noted during this session sats dropped to 86% on RA during ambulation.  At rest on RA after amb. sats hoved between 88-90%.  With 1 L O@ placed sats incr. To 98% then after withdrawal of O2, sats declined to 91% over 10 min.      Mobility  Bed Mobility Bed Mobility: Supine to Sit;Sitting - Scoot to Edge of Bed;Sit to Supine Supine to Sit: 5: Supervision;With rails Sitting - Scoot to Edge of Bed: 5: Supervision;With rail Transfers Transfers: Sit to Stand;Stand to Sit;Stand Pivot Transfers Sit to Stand: 4: Min guard;With upper extremity assist;From bed;From chair/3-in-1;Other (comment) Stand to Sit: 4: Min  guard;With upper extremity assist;To bed;To chair/3-in-1 Stand Pivot Transfers: 4: Min guard Details for Transfer Assistance: vc's for hand placement; min guard for safety Ambulation/Gait Ambulation/Gait Assistance: 4: Min guard Ambulation Distance (Feet): 90 Feet (then 25 feet) Assistive device: Rolling walker Ambulation/Gait Assistance Details: weak, steppage gait with foot flat contact that improved with distance and time on her feet. Gait Pattern: Step-through pattern;Decreased step length - right;Decreased step length - left;Decreased stride length Stairs: No    Exercises     PT Diagnosis: Generalized weakness  PT Problem List: Decreased strength;Decreased activity tolerance;Decreased balance;Decreased mobility;Decreased knowledge of use of DME;Cardiopulmonary status limiting activity PT Treatment Interventions: DME instruction;Gait training;Functional mobility training;Therapeutic activities;Balance training;Patient/family education   PT Goals Acute Rehab PT Goals PT Goal Formulation: With patient Time For Goal Achievement: 07/27/12 Potential to Achieve Goals: Good Pt will go Sit to Stand: Independently;with upper extremity assist PT Goal: Sit to Stand - Progress: Goal set today Pt will Transfer Bed to Chair/Chair to Bed: with modified independence PT Transfer Goal: Bed to Chair/Chair to Bed - Progress: Goal set today Pt will Ambulate: 51 - 150 feet;with modified independence;with least restrictive assistive device PT Goal: Ambulate - Progress: Goal set today  Visit Information  Last PT Received On: 07/20/12 Assistance Needed: +1    Subjective Data  Subjective: I don't think I should go home yet, I haven't even ate any solids or gone to the bathroom. Patient Stated Goal: Home and live alone   Prior Functioning  Home Living  Lives With: Alone Available Help at Discharge: Other (Comment) (niece is POA and assists intermittently) Type of Home: Apartment Home Access: Level  entry Home Layout: One level Bathroom Shower/Tub: Other (comment) (takes sponge baths) Bathroom Accessibility: Yes Home Adaptive Equipment: Bedside commode/3-in-1;Walker - four wheeled;Other (comment) (lift chair) Prior Function Able to Take Stairs?: No Driving: No Communication Communication: No difficulties    Cognition  Cognition Overall Cognitive Status: Appears within functional limits for tasks assessed/performed Arousal/Alertness: Awake/alert Orientation Level: Appears intact for tasks assessed Behavior During Session: [facility] for tasks performed    Extremity/Trunk Assessment Right Lower Extremity Assessment RLE ROM/Strength/Tone: [facility] for tasks assessed;Deficits RLE ROM/Strength/Tone Deficits: bil generally weak Left Lower Extremity Assessment LLE ROM/Strength/Tone: WFL for tasks assessed;Deficits LLE ROM/Strength/Tone Deficits: bil generally weak   Balance Balance Balance Assessed: Yes Dynamic Standing Balance Dynamic Standing - Balance Support: No upper extremity supported;During functional activity;Right upper extremity supported Dynamic Standing - Level of Assistance: 5: Stand by assistance Dynamic Standing - Balance Activities: Forward lean/weight shifting;Reaching for objects;Other (comment) (while washing her hands)  End of Session PT - End of Session Equipment Utilized During Treatment: Oxygen Activity Tolerance: Patient tolerated treatment well;Patient limited by fatigue Patient left: in chair;with call bell/phone within reach  GP     Mykell Genao, Eliseo Gum 07/20/2012, 3:34 PM 07/20/2012  Raymond Bing, PT [phone] (pager)

## 2012-07-20 NOTE — Progress Notes (Signed)
Internal Medicine Attending  Date: 07/20/2012  Patient name: [name] Medical record number: [id number] Date of birth: 11-Apr-1920 Age: 79 y.o. Gender: female  I saw and evaluated the patient. I reviewed the resident's note by Dr. Collier Bullock and I agree with the resident's findings and plans as documented in her progress note.  [name] tolerated clear liquids without any difficulties. Her diet will be advanced to a regular diet. We will see how she tolerates that. When getting up to ambulate today on room air she was noted to desaturate to 86%. She therefore required oxygen. This may be related to some inadvertent IV fluid hydration last night and this has been definitively stopped. She may require a dose of a loop diuretic to see if this helps improve her saturations with ambulation. If not, she will need to be discharged home on home oxygen. Physical therapy also recommended supervised home physical therapy. We will try to get this arranged upon discharge. I anticipate she may be ready in the next 24 hours if she tolerates a regular diet.

## 2012-07-20 NOTE — Progress Notes (Signed)
Patient ID: [name], female   DOB: Dec 05, 1919, 79 y.o.   MRN: [id number]    Subjective: Pt feels well.  No pain with liquids.  No nausea  Objective: Vital signs in last 24 hours: Temp:  [98.3 F (36.8 C)-98.7 F (37.1 C)] 98.7 F (37.1 C) (02/21 0618) Pulse Rate:  [73-78] 73 (02/21 0618) Resp:  [16-17] 16 (02/21 0618) BP: (103-115)/(50-69) 115/69 mmHg (02/21 0618) SpO2:  [96 %-98 %] 98 % (02/21 0618) Weight:  [133 lb 11.2 oz (60.646 kg)] 133 lb 11.2 oz (60.646 kg) (02/21 0618) Last BM Date: 07/18/12  Intake/Output from previous day: 02/20 0701 - 02/21 0700 In: -  Out: 1550 [Urine:1550] Intake/Output this shift:    PE: Abd: soft, NT, ND, +BS Heart: regular, + murmur  Lab Results:   Recent Labs  07/18/12 0105  WBC 7.7  HGB 11.4*  HCT 35.1*  PLT 249   BMET  Recent Labs  07/18/12 0105 07/20/12 0558  NA 142 143  K 3.2* 4.2  CL 97 106  CO2 33* 33*  GLUCOSE 120* 91  BUN 23 16  CREATININE 1.31* 1.20*  CALCIUM 9.9 8.6   PT/INR No results found for this basename: LABPROT, INR,  in the last 72 hours CMP     Component Value Date/Time   NA 143 07/20/2012 0558   K 4.2 07/20/2012 0558   CL 106 07/20/2012 0558   CO2 33* 07/20/2012 0558   GLUCOSE 91 07/20/2012 0558   BUN 16 07/20/2012 0558   CREATININE 1.20* 07/20/2012 0558   CALCIUM 8.6 07/20/2012 0558   PROT 7.6 07/18/2012 0105   ALBUMIN 4.2 07/18/2012 0105   AST 14 07/18/2012 0105   ALT 5 07/18/2012 0105   ALKPHOS 112 07/18/2012 0105   BILITOT 0.5 07/18/2012 0105   GFRNONAA 39* 07/20/2012 0558   GFRAA 45* 07/20/2012 0558   Lipase     Component Value Date/Time   LIPASE 40 07/18/2012 0105       Studies/Results: Dg Chest Port 1 View  07/18/2012  *RADIOLOGY REPORT*  Clinical Data: Evaluate nasogastric tube position  PORTABLE CHEST - 1 VIEW  Comparison: Recent CT chest abdomen pelvis 07/18/2012  Findings: Interval placement of a nasogastric tube.  Nasogastric tube is coiled in the mid thorax, likely within the  intrathoracic portion of the massive hiatal hernia.  Otherwise, unchanged appearance of the chest.  IMPRESSION: The nasogastric tube is coiled within the intrathoracic portion of the stomach.   Original Report Authenticated By: Malachy Moan, M.D.    Dg Abd Portable 1v  07/18/2012  *RADIOLOGY REPORT*  Clinical Data: NG tube placement.  PORTABLE ABDOMEN - 1 VIEW  Comparison: Chest CT 07/18/2012.  Findings: The NG tube is barely visualized at the very top of the film.  It appears to be in the intrathoracic portion of the stomach.  IMPRESSION: NG tube appears to be in the intrathoracic portion of the stomach. A chest x-ray is recommended for confirmation.   Original Report Authenticated By: Rudie Meyer, M.D.     Anti-infectives: Anti-infectives   None       Assessment/Plan  1. Large hiatal hernia  Plan: 1. Patient did well with clear liquids, will advance to full liquids and hope to avoid an operation this admission.   LOS: 2 days    Lacreshia Bondarenko E 07/20/2012, 9:53 AM Pager: [phone]

## 2012-07-20 NOTE — Evaluation (Signed)
Occupational Therapy Evaluation Patient Details Name: [name] MRN: [id number] DOB: January 05, 1920 Today's Date: 07/20/2012 Time: 4098-1191 OT Time Calculation (min): 12 min  OT Assessment / Plan / Recommendation Clinical Impression  79 yo female admitted for 1 week h/o abdominal pain with n/v.  Determined to likely be caused by gastric outlet dysfunction. Ot to follow acutely. Recommend HHOT for d/c planning    OT Assessment  Patient needs continued OT Services    Follow Up Recommendations  Home health OT    Barriers to Discharge      Equipment Recommendations  3 in 1 bedside comode (RW)    Recommendations for Other Services    Frequency  Min 3X/week    Precautions / Restrictions Precautions Precautions: Fall Restrictions Weight Bearing Restrictions: No   Pertinent Vitals/Pain     ADL  Grooming: Wash/dry hands;Supervision/safety Where Assessed - Grooming: Unsupported standing Toilet Transfer: Moderate assistance Toilet Transfer Method: Sit to stand Toilet Transfer Equipment: Regular height toilet;Grab bars Toileting - Clothing Manipulation and Hygiene: Minimal assistance Where Assessed - Toileting Clothing Manipulation and Hygiene: Sit to stand from 3-in-1 or toilet Equipment Used: Gait belt;Rolling walker Transfers/Ambulation Related to ADLs: Supervision level with RW  ADL Comments: Pt remained 47% or higher on RA during testing. Pulmonary assessment charted. Pt needs to be able to return home independent    OT Diagnosis: Generalized weakness  OT Problem List: Decreased strength;Decreased activity tolerance;Impaired balance (sitting and/or standing);Decreased safety awareness;Decreased knowledge of precautions;Decreased knowledge of use of DME or AE OT Treatment Interventions: Self-care/ADL training;Therapeutic exercise;Energy conservation;DME and/or AE instruction;Therapeutic activities;Patient/family education;Balance training   OT Goals Acute Rehab OT Goals OT  Goal Formulation: With patient Time For Goal Achievement: 08/03/12 Potential to Achieve Goals: Good ADL Goals Pt Will Perform Grooming: with modified independence;Standing at sink;Unsupported ADL Goal: Grooming - Progress: Goal set today Pt Will Perform Upper Body Bathing: with modified independence;Sit to stand from chair ADL Goal: Upper Body Bathing - Progress: Goal set today Pt Will Perform Lower Body Bathing: with modified independence;Sit to stand from chair ADL Goal: Lower Body Bathing - Progress: Goal set today Pt Will Perform Upper Body Dressing: with modified independence;Sit to stand from chair ADL Goal: Upper Body Dressing - Progress: Goal set today Pt Will Perform Lower Body Dressing: with modified independence;Sit to stand from chair ADL Goal: Lower Body Dressing - Progress: Goal set today Pt Will Transfer to Toilet: with modified independence;Ambulation;3-in-1 ADL Goal: Toilet Transfer - Progress: Goal set today  Visit Information  Last OT Received On: 07/20/12 Assistance Needed: +1    Subjective Data  Subjective: "i think i can try at this time" Patient Stated Goal: to return home alone   Prior Functioning     Home Living Lives With: Alone Available Help at Discharge: Other (Comment) (niece is POA and assists intermittently) Type of Home: Apartment Home Access: Level entry Home Layout: One level Bathroom Shower/Tub: Other (comment) (takes sponge baths) Bathroom Accessibility: Yes Home Adaptive Equipment: Bedside commode/3-in-1;Walker - four wheeled;Other (comment) Prior Function Able to Take Stairs?: No Driving: No Communication Communication: No difficulties Dominant Hand: Right         Vision/Perception     Cognition  Cognition Overall Cognitive Status: Appears within functional limits for tasks assessed/performed Arousal/Alertness: Awake/alert Orientation Level: Appears intact for tasks assessed Behavior During Session: [facility] for tasks  performed    Extremity/Trunk Assessment Right Upper Extremity Assessment RUE ROM/Strength/Tone: Within functional levels RUE Coordination: WFL - gross/fine motor Left Upper Extremity Assessment LUE  ROM/Strength/Tone: Within functional levels Right Lower Extremity Assessment RLE ROM/Strength/Tone: [facility] for tasks assessed;Deficits RLE ROM/Strength/Tone Deficits: bil generally weak Left Lower Extremity Assessment LLE ROM/Strength/Tone: WFL for tasks assessed;Deficits LLE ROM/Strength/Tone Deficits: bil generally weak Trunk Assessment Trunk Assessment: Kyphotic     Mobility Bed Mobility Bed Mobility: Not assessed Supine to Sit: 5: Supervision;With rails Sitting - Scoot to Edge of Bed: 5: Supervision;With rail Transfers Transfers: Sit to Stand;Stand to Sit Sit to Stand: 3: Mod assist;With upper extremity assist;From chair/3-in-1 Stand to Sit: 3: Mod assist;With upper extremity assist;To chair/3-in-1 Details for Transfer Assistance: pt using bil UE and at baseline uses lift chair     Exercise     Balance Balance Balance Assessed: Yes Dynamic Standing Balance Dynamic Standing - Balance Support: No upper extremity supported;During functional activity Dynamic Standing - Level of Assistance: 5: Stand by assistance Dynamic Standing - Balance Activities: Forward lean/weight shifting;Reaching for objects;Other (comment) (while washing her hands)   End of Session OT - End of Session Activity Tolerance: Patient tolerated treatment well Patient left: in chair;with call bell/phone within reach Nurse Communication: Mobility status  GO     Harrel Carina [facility] 07/20/2012, 4:53 PM Pager: [phone]

## 2012-07-20 NOTE — Progress Notes (Signed)
I have seen and examined the patient and agree with the assessment and plans.  Imanie Darrow A. Jarquez Mestre  MD, FACS  

## 2012-07-21 MED ORDER — FLEET ENEMA 7-19 GM/118ML RE ENEM
1.0000 | ENEMA | Freq: Once | RECTAL | Status: AC
  Administered 2012-07-21: 13:00:00 via RECTAL
  Filled 2012-07-21: qty 1

## 2012-07-21 NOTE — Progress Notes (Signed)
  Subjective: No c/o. No abd pain. No n/v. Tolerated diet. +flatus.  Objective: Vital signs in last 24 hours: Temp:  [98 F (36.7 C)-98.5 F (36.9 C)] 98 F (36.7 C) (02/22 0500) Pulse Rate:  [68-84] 83 (02/22 0823) Resp:  [15-18] 15 (02/22 0500) BP: (93-124)/(52-69) 103/64 mmHg (02/22 0823) SpO2:  [90 %-93 %] 93 % (02/22 0500) Weight:  [132 lb 11.2 oz (60.192 kg)] 132 lb 11.2 oz (60.192 kg) (02/22 0500) Last BM Date: 07/16/12  Intake/Output from previous day: 02/21 0701 - 02/22 0700 In: 300  Out: 350 [Urine:350] Intake/Output this shift: Total I/O In: -  Out: 650 [Urine:650]  Alert, nad, sitting in chair abd soft, nt, nd  Lab Results:  No results found for this basename: WBC, HGB, HCT, PLT,  in the last 72 hours BMET  Recent Labs  07/20/12 0558  NA 143  K 4.2  CL 106  CO2 33*  GLUCOSE 91  BUN 16  CREATININE 1.20*  CALCIUM 8.6   PT/INR No results found for this basename: LABPROT, INR,  in the last 72 hours ABG No results found for this basename: PHART, PCO2, PO2, HCO3,  in the last 72 hours  Studies/Results: No results found.  Anti-infectives: Anti-infectives   None      Assessment/Plan: Large hiatal hernia  Doing well No need for surgery this admission Ok with d/c from our standpoint No need to f/u with [place].   Regina Sella. Andrey Campanile, [name], [name] General, Bariatric, & Minimally Invasive Surgery [facility], [place]   LOS: 3 days    Atilano Ina 07/21/2012

## 2012-07-21 NOTE — Progress Notes (Signed)
Pt c/o feeling "swimmy headed"+nausea from fecal impaction.  VS stable.  Pt had not had BM since 2/17.  Dulcolax suppository and manual stimulation were both unsuccessful.  Fleets Enema given at 13:00.  Pt was very emotionally distressed about this and stated this had never happened to her before.  Emotional support given to pt.  Large BM evacuated successfully and pt back in bed resting.  MD notified.  Will obtain Orthostatic VS.  If these are negative, will continue with current plan to d/c pt home today.

## 2012-07-21 NOTE — Care Management Note (Signed)
   CARE MANAGEMENT NOTE 07/21/2012  Patient:  [name], [name]   Account Number:  [id number]  Date Initiated:  07/18/2012  Documentation initiated by:  GRAVES-BIGELOW,BRENDA  Subjective/Objective Assessment:   Pt admitted as transfer from [facility] with abdominal pain and N/V. Plan for iv fluids and surgery to consult.     Action/Plan:   CM will continue to monitor for disposition needs.   Anticipated DC Date:  07/21/2012   Anticipated DC Plan:  HOME W HOME HEALTH SERVICES      DC Planning Services  CM consult      [facility] Choice  HOME HEALTH   Choice offered to / List presented to:  C-1 Patient        HH arranged  HH-2 PT  HH-3 OT      [facility] agency  [facility]   Status of service:  Completed, signed off Medicare Important Message given?   (If response is "NO", the following Medicare IM given date fields will be blank) Date Medicare IM given:   Date Additional Medicare IM given:    Discharge Disposition:  HOME W HOME HEALTH SERVICES  Per UR Regulation:  Reviewed for med. necessity/level of care/duration of stay  If discussed at Long Length of Stay Meetings, dates discussed:    Comments:  07/21/12 at 1412 [facility] [phone] Call received from [name] with [name] stating [name] is unable to accomodate patient in the [facility] office. Patient's nurse called shortly after getting off the phone with [name] with [name] to be sure this RNCM was aware that [name] could not take referral. Discussed that patient's second choice was [facility] with [name]/niece and also made [name] aware that [facility] will follow for PT/OT services. Also [name] asked if home health aide be added. Nursing aware of the needed order for [facility] aide. Spoke with [facility] referral center to confirm they can accomodate [place] address. Faxed face sheet with receipt of faxed confirmation. No further needs assessed. [email or web address]     07/21/12 Received referral for PT/OT services.  Spoke with patient at bedside who states she has had [facility] in the past, however, she states she would like to change home health agencies. Chose [facility]. Spoke with [name], hospital liaison to make referral. States [name] will be able to accept patient for  PT/OT services. Will fax information to [phone]. Spoke with patient's niece, [name] at [phone] to make aware that patient is discharging home today with home health. She reports patient has rw/ shower chair and potty chair at home. States she sometimes stays with patient at home at night and that she thinks [name] will be fine going home this time. She also has life alert per [name]. Oxygen was not needed at discharge. [name] also reports she has her follow up appointment already scheduled with Dr Juanetta Gosling in 2 weeks. [place], [place] [phone]

## 2012-07-21 NOTE — Progress Notes (Signed)
MD verified that pt is medically stable and should get d/c home today.  Pt and niece notified.  They agree with d/c. Home health aide has been ordered, per MD.  Pt feeling better now.  Nausea is relieved.

## 2012-07-21 NOTE — Care Management Note (Signed)
Left voicemail for Susie, niece to make aware that patient chose [facility] and that information will also be placed on her discharge sheet. Call to patient's nurse to make aware home health was arranged with [facility] as well.  [place], [place] [phone]

## 2012-07-21 NOTE — Care Management Note (Addendum)
   CARE MANAGEMENT NOTE 07/21/2012  Patient:  [name], [name]   Account Number:  [id number]  Date Initiated:  07/18/2012  Documentation initiated by:  GRAVES-BIGELOW,BRENDA  Subjective/Objective Assessment:   Pt admitted as transfer from [facility] with abdominal pain and N/V. Plan for iv fluids and surgery to consult.     Action/Plan:   CM will continue to monitor for disposition needs.   Anticipated DC Date:  07/21/2012   Anticipated DC Plan:  HOME W HOME HEALTH SERVICES      DC Planning Services  CM consult      [facility] Choice  HOME HEALTH   Choice offered to / List presented to:  C-1 Patient        HH arranged  HH-2 PT  HH-3 OT      [facility] agency  [facility]   Status of service:  Completed, signed off Medicare Important Message given?   (If response is "NO", the following Medicare IM given date fields will be blank) Date Medicare IM given:   Date Additional Medicare IM given:    Discharge Disposition:  HOME W HOME HEALTH SERVICES  Per UR Regulation:  Reviewed for med. necessity/level of care/duration of stay  If discussed at Long Length of Stay Meetings, dates discussed:    Comments:  07/21/12 Received referral for PT/OT services. Spoke with patient at bedside who states she has had [facility] in the past, however, she states she would like to change home health agencies. Chose [facility]. Spoke with Lupita Leash, hospital liaison to make referral. States [name] will be able to accept patient for  PT/OT services. Will fax information to [phone], with faxed confirmation received.Marland Kitchen Spoke with patient's niece, [name] at [phone] to make aware that patient is discharging home today with home health. She reports patient has rw/ shower chair and potty chair at home. States she sometimes stays with patient at home at night and that she thinks [name] will be fine going home this time. She also has life alert per Susie. Oxygen was not needed at discharge. Regina  Gallegos also reports she has her follow up appointment already scheduled with Dr [name] in 2 weeks. [place], [place] [phone]

## 2012-07-21 NOTE — Progress Notes (Signed)
Subjective: The patient tolerated a regular diet with no nausea, vomiting, or abdominal pain.  The patient states she feels "much better" today.  The patient worked with PT/OT yesterday, who recommended [facility] PT/OT.  Objective: Vital signs in last 24 hours: Filed Vitals:   07/20/12 1808 07/20/12 2100 07/21/12 0500 07/21/12 0823  BP: 115/52 93/60 113/69 103/64  Pulse: 84 68 68 83  Temp:  98.5 F (36.9 C) 98 F (36.7 C)   TempSrc:  Oral Oral   Resp:  18 15   Height:      Weight:   132 lb 11.2 oz (60.192 kg)   SpO2:  90% 93%    Weight change: -1 lb (-0.454 kg)  Intake/Output Summary (Last 24 hours) at 07/21/12 1010 Last data filed at 07/21/12 0800  Gross per 24 hour  Intake    300 ml  Output   1000 ml  Net   -700 ml    Physical Exam Blood pressure 103/64, pulse 83, temperature 98 F (36.7 C), temperature source Oral, resp. rate 15, height 5\' 3"  (1.6 m), weight 132 lb 11.2 oz (60.192 kg), SpO2 93.00%. General:  No acute distress, alert and oriented x 3 HEENT:  PERRL, EOMI, moist mucous membranes Cardiovascular:  Regular rate and rhythm, holosystolic murmur  Respiratory:  Mild rales in bases, otherwise clear no wheezes or rhonchi Abdomen:  Soft, nondistended, nontender to palpation, bowel sounds present. Well-healed lower midline scar noted. Extremities:  Warm and well-perfused, no edema.  Skin: Warm, dry, no rashes Neuro: Not anxious appearing, no depressed mood, normal affect  Lab Results: Basic Metabolic Panel:  Recent Labs Lab 07/18/12 0105 07/20/12 0558  NA 142 143  K 3.2* 4.2  CL 97 106  CO2 33* 33*  GLUCOSE 120* 91  BUN 23 16  CREATININE 1.31* 1.20*  CALCIUM 9.9 8.6   Liver Function Tests:  Recent Labs Lab 07/18/12 0105  AST 14  ALT 5  ALKPHOS 112  BILITOT 0.5  PROT 7.6  ALBUMIN 4.2    Recent Labs Lab 07/18/12 0105  LIPASE 40   CBC:  Recent Labs Lab 07/18/12 0105  WBC 7.7  NEUTROABS 5.8  HGB 11.4*  HCT 35.1*  MCV 90.2  PLT 249    Cardiac Enzymes:  Recent Labs Lab 07/18/12 0105  TROPONINI <0.30   Medications:  Medications reviewed  Scheduled Meds: . aspirin EC  81 mg Oral Daily  . carvedilol  3.125 mg Oral BID WC  . docusate sodium  100 mg Oral Daily  . escitalopram  20 mg Oral Daily  . heparin  5,000 Units Subcutaneous Q8H  . pantoprazole  40 mg Oral Q1200  . simvastatin  40 mg Oral QPM  . sodium phosphate  1 enema Rectal Once  . torsemide  100 mg Oral Daily   Continuous Infusions:   PRN Meds:.  Assessment/Plan:  Patient is a pleasant 79 year old woman with past history significant for CAD, aortic stenosis, GERD, hiatal hernia and other medical problems as per problem list who presented with chief complaints of epigastric and chest pain for past one week along with new onset vomiting.  Presumed Gastric Outlet Obstruction: resolving Patient with large hiatal hernia with recent enlargement and bowel decompression concerning for GOO.  Patient presented with some abdominal pain, nausea, vomiting and imaging findings of large thoracic hiatal hernia with recent enlargement in size. Patient describes recent worsening GERD symptoms.  2/20:Patient did well overnight with NG tube suction, anti-emetics. She states she is doing much  better this morning, no pain 2/21: doing well, pt passing gas, no nausea or vomiting 2/22: Patient tolerating regular diet with no n/v/abd pain.  Will discharge home. -continue regular diet  -suppository for BM  CAD with AS and Mitral insufficiency: Patient was history of CAD status post MI about 6 years ago, no stent placement. She has chronic AS and mitral insufficiency - managed by Dr. Recardo Evangelist in [place]. Discussed with Dr. Tiburcio Pea and patient is not supposed to be taking both lasix and torsemide. She has follow up with him in May. 2/21: patient with mild crackles on exam, likely from IVFs.  2/22: Lungs clear, patient notes no SOB -cont home torsemide -add back PO  home meds  Hypertension: Blood pressure low on presentation. -resume PO BP meds at discharge  DVT prophylaxis: Subcutaneous heparin.   Code Status -pt is DNR  Dispo:  Patient lives by herself with help from niece Susie with medications.  Patient will be discharged home today, with [facility] PT/OT.  The patient does have a current PCP (HAWKINS,EDWARD L, MD), therefore will not be requiring OPC follow-up after discharge.   The patient does not have transportation limitations that hinder transportation to clinic appointments.   LOS: 3 days   Janalyn Harder 07/21/2012, 10:10 AM

## 2012-07-21 NOTE — Progress Notes (Signed)
Pt refusing to go home.  States she feels nauseated, dizzy, and very weak.  Also, she is not able to hold her urine.  Niece, Lynnell Dike, at bedside.  Pt requesting a Nursing Aide (upon d/c) since she lives at home alone and niece has had recent back surgery.  Pt has no other family/friends to help her at home.  MD notified about pt status.  Awaiting further orders.

## 2012-07-21 NOTE — Discharge Summary (Signed)
Internal Medicine Teaching [facility] Discharge Note  Name: [name] MRN: [id number] DOB: [birth year] 79 y.o.  Date of Admission: 07/18/2012 12:40 AM Date of Discharge: 07/21/2012 Attending Physician: Rocco Serene, MD  Discharge Diagnosis: 1. Gastric Outlet Obstruction - 2/2 hiatal hernia, resolved 2. Hiatal Hernia 3. Coronary Artery Disease - with AS and mitral insufficiency 4. Hypertension 5. Hyperlipidemia 6. Anxiety  Discharge Medications:   Medication List    STOP taking these medications       furosemide 40 MG tablet  Commonly known as:  LASIX      TAKE these medications       aspirin 81 MG tablet  Take 81 mg by mouth daily.     carvedilol 3.125 MG tablet  Commonly known as:  COREG  Take 3.125 mg by mouth 2 (two) times daily with a meal.     cyclobenzaprine 10 MG tablet  Commonly known as:  FLEXERIL  Take 10 mg by mouth 3 (three) times daily as needed for muscle spasms.     escitalopram 20 MG tablet  Commonly known as:  LEXAPRO  Take 20 mg by mouth daily.     LORazepam 1 MG tablet  Commonly known as:  ATIVAN  Take 1 mg by mouth every 12 (twelve) hours.     mirtazapine 15 MG disintegrating tablet  Commonly known as:  REMERON SOL-TAB  Take 15 mg by mouth at bedtime.     oxyCODONE-acetaminophen 5-325 MG per tablet  Commonly known as:  PERCOCET/ROXICET  Take 1 tablet by mouth every 4 (four) hours as needed for pain.     pantoprazole 40 MG tablet  Commonly known as:  PROTONIX  Take 40 mg by mouth daily.     potassium chloride SA 20 MEQ tablet  Commonly known as:  K-DUR,KLOR-CON  Take 20 mEq by mouth daily.     simvastatin 40 MG tablet  Commonly known as:  ZOCOR  Take 40 mg by mouth every evening.     torsemide 100 MG tablet  Commonly known as:  DEMADEX  Take 100 mg by mouth daily.     travoprost (benzalkonium) 0.004 % ophthalmic solution  Commonly known as:  TRAVATAN  Place 1 drop into both eyes at bedtime.     zolpidem 5 MG  tablet  Commonly known as:  AMBIEN  Take 5 mg by mouth at bedtime as needed for sleep.        Disposition and follow-up:   Ms.[name] was discharged from [facility] in stable and improved condition, with resolution of abdominal pain, nausea, and vomiting.  At hospital follow-up, please address the following: 1. Ensure patient has not had recurrence of nausea/vomiting 2. Address need to continue home PT and OT  Follow-up Appointments:  Follow-up Information   Follow up with HAWKINS,EDWARD L, MD. Schedule an appointment as soon as possible for a visit in 2 weeks.   Contact information:   [address] [place] [address] [phone]       Follow up with [facility] . [facility] PT/OT services)    Contact information:   [phone]       Consultations: [facility] Surgery (Dr. Magnus Ivan)  Procedures Performed:  Ct Abdomen Pelvis W Contrast  07/18/2012  **ADDENDUM** CREATED: 07/18/2012 21:53:41  Air fluid collection along the posterior margin of the duodenojejunal junction without surrounding inflammation is favored to reflect a prominent diverticulum.  **END ADDENDUM** SIGNED BY: Greig Castilla  Burnis Kingfisher, M.D.   07/18/2012  *RADIOLOGY REPORT*  Clinical Data:  Epigastric pain, chest pain.  History of hiatal hernia.  CT CHEST, ABDOMEN AND PELVIS WITH CONTRAST  Technique:  Multidetector CT imaging of the chest, abdomen and pelvis was performed following the standard protocol during bolus administration of intravenous contrast.  Contrast: 1 OMNIPAQUE IOHEXOL 300 MG/ML  SOLN, 80mL OMNIPAQUE IOHEXOL 300 MG/ML  SOLN .  Comparison:  07/18/2012 radiograph  CT CHEST  Findings:  Enlarged thyroid with multiple nodules of varying complexity.  Normal caliber aorta.  Heart displaced anteriorly by the large the hiatal hernia.  There is organoaxial rotation.  The distal stomach and small bowel loops are decompressed.  No lymphadenopathy.  Central  airways are patent.  Mild peripheral reticular opacities. Mild bibasilar atelectasis and trace effusions. Mild areas of mosaic attenuation/atelectasis versus air trapping.  Osteopenia.  Right shoulder hardware.  Exaggerated kyphosis.  IMPRESSION: Markedly distended stomach, partially intrathoracic, with organoaxial rotation.  Given the distal decompression, gastric volvulus is of concern.  Complex left thyroid lobe nodules.  If clinically warranted, consider ultrasound.  CT ABDOMEN AND PELVIS  Findings:  A few hepatic hypodensities are nonspecific, favor cysts.  Unremarkable biliary system, spleen, adrenal glands.  Mild dilatation of the pancreatic duct within the head/neck.  In addition, there is dilatation of the side branch versus accessory duct along the head/uncinate process.  Unremarkable adrenal glands.  Bilateral renal cysts and incompletely characterized hypodensities. No hydronephrosis or hydroureter.  Colonic diverticulosis.  The colon and small bowel are essentially decompressed.  There is mild stranding of the small bowel root mesentery.  No lymphadenopathy.  Normal caliber aorta and branch vessels.  Left adnexal cyst has a nonaggressive appearance.  Thin-walled bladder.  Absent uterus.  Partially imaged right hip hardware.  Diffuse osteopenia and multilevel degenerative change.  T12 hemangioma.  Minimal anterolisthesis of L4 on L5.  IMPRESSION: Decompressed small bowel and colon. See chest report above.  Discussed via telephone with Dr. Judd Lien at 04:15 a.m. on 07/18/2012.  Original Report Authenticated By: Jearld Lesch, M.D.    Dg Chest Port 1 View  07/18/2012  *RADIOLOGY REPORT*  Clinical Data: Evaluate nasogastric tube position  PORTABLE CHEST - 1 VIEW  Comparison: Recent CT chest abdomen pelvis 07/18/2012  Findings: Interval placement of a nasogastric tube.  Nasogastric tube is coiled in the mid thorax, likely within the intrathoracic portion of the massive hiatal hernia.  Otherwise, unchanged  appearance of the chest.  IMPRESSION: The nasogastric tube is coiled within the intrathoracic portion of the stomach.   Original Report Authenticated By: Malachy Moan, M.D.    Dg Chest Port 1 View  07/18/2012  *RADIOLOGY REPORT*  Clinical Data: Chest and epigastric pain  PORTABLE CHEST - 1 VIEW  Comparison: Prior chest x-ray 04/16/2007  Findings: Compared to the remote prior study, there is been a marked increase in the size of the hiatal hernia which now splays the carina.  The cardiopericardial silhouette is largely obscured by the large intrathoracic hiatal hernia.  Atherosclerotic calcifications noted in the transverse aorta.  Chronic central bronchitic changes and prominence of interstitial markings similar to prior.  No acute osseous abnormality.  Prior ORIF of a right humeral neck fracture.  IMPRESSION:  1.  Marked enlargement of massive hiatal hernia compared to the prior chest x-ray from November 2008. 2.  No definite acute cardiopulmonary disease although the large hiatal hernia obscures the majority of the thorax.   Original Report Authenticated By: Malachy Moan,  M.D.    Admission HPI:  Patient is a pleasant 79 year old woman with past history significant for CAD, aortic stenosis, GERD, hiatal hernia and other medical problems as per problem list who came to [facility] last night with chief complaints of epigastric and chest pain for past one week along with new onset vomiting.  Patient was having worsening symptoms of her reflux disease for past week. She was complaining of more epigastric and substernal central chest pain. She describes that is worsening of her GERD. Finally she had an episode of vomiting around midnight yesterday when she decided to go to the ED.  She denies any fever, although she stays cold all the time, headaches, diarrhea, palpitations, vision changes.  She did have likely upper respiratory tract infection and cough about 3 weeks ago.  She reports improvement in  her symptoms after NG tube placement and getting antiemetics and pain medications in ED.  She lives by herself and her niece who is accompanying her in ED visits her every day and helps her.  Admission Physical Exam Blood pressure 119/43, pulse 71, temperature 98.1 F (36.7 C), temperature source Oral, resp. rate 20, height 5\' 2"  (1.575 m), weight 140 lb (63.504 kg), SpO2 99.00%.  Constitutional: Vital signs reviewed. Patient is old and cachectic, in no acute distress and cooperative with exam. Alert and oriented x3.  Head: Normocephalic and atraumatic  Mouth: no erythema or exudates, MMM  Eyes: PERRL, EOMI, conjunctivae normal, No scleral icterus.  Neck: Supple, Trachea midline normal ROM, No JVD  Cardiovascular: RRR, S1 normal, S2 normal, holosystolic murmur.  Pulmonary/Chest: CTAB, no wheezes, rales, or rhonchi  Abdominal: Distended epigastrium. Non-tender, bowel sounds hypoactive  Musculoskeletal: No joint deformities, erythema, or stiffness, ROM full and no nontender Neurological: A&O x3, Strength is normal and symmetric bilaterally, cranial nerve II-XII are grossly intact, no focal motor deficit, sensory intact to light touch bilaterally.  Skin: Warm, dry and intact. No rash, cyanosis, or clubbing.  Psychiatric: Normal mood and affect. speech and behavior is normal. Judgment and thought content normal. Cognition and memory are normal.   Admission Labs Basic Metabolic Panel:   07/18/12 0105   NA  142   K  3.2*   CL  97   CO2  33*   GLUCOSE  120*   BUN  23   CREATININE  1.31*   CALCIUM  9.9    Liver Function Tests:   07/18/12 0105   AST  14   ALT  5   ALKPHOS  112   BILITOT  0.5   PROT  7.6   ALBUMIN  4.2    CBC:   07/18/12 0105   WBC  7.7   NEUTROABS  5.8   HGB  11.4*   HCT  35.1*   MCV  90.2   PLT  249     Hospital Course by problem list: 1. Gastric Outlet Obstruction - The patient presented with nausea, vomiting, and abdominal pain, with CT evidence of a  dilated hiatal hernia within the chest cavity, concerning for gastric outlet obstruction.  An NG tube was placed with resolution of symptoms.  The patient's NG tube was subsequently removed, and she was advanced to a regular diet with no recurrence of symptoms.  Surgery was consulted for possible intervention, but symptoms resolved without intervention, and the patient expressed the opinion that she would refuse surgery even if it were offered given her own perceived frailty.  2. Hiatal Hernia - seen on  CXR and CT scan (see problem #1 above).  3. Coronary Artery Disease - The patient has a history of CAD with AS and mitral insufficiency, s/p MI 6 years ago with no stent placement.  The patient is managed by Dr. Recardo Evangelist in [place]  The patient was asymptomatic during her hospitalization, and was continued on aspirin, coreg, and torsemide at discharge.    4. Hypertension - The patient has a history of HTN.  Her home BP meds were initially held, but were resumed at hospital discharge.  5. Hyperlipidemia - The patient has a history of hyperlipidemia.  Patient on simvastatin  Time spent on discharge: 45 minutes  Discharge Vitals:  BP 103/64  Pulse 83  Temp(Src) 98 F (36.7 C) (Oral)  Resp 15  Ht 5\' 3"  (1.6 m)  Wt 132 lb 11.2 oz (60.192 kg)  BMI 23.51 kg/m2  SpO2 93%  Discharge Labs: No results found for this or any previous visit (from the past 24 hour(s)).  SignedJanalyn Harder 07/21/2012, 10:37 AM

## 2012-08-10 ENCOUNTER — Encounter (INDEPENDENT_AMBULATORY_CARE_PROVIDER_SITE_OTHER): Payer: Self-pay | Admitting: *Deleted

## 2012-08-11 ENCOUNTER — Inpatient Hospital Stay (HOSPITAL_COMMUNITY)
Admission: EM | Admit: 2012-08-11 | Discharge: 2012-08-12 | DRG: 392 | Disposition: A | Discharge status: Other Acute Inpatient Hospital | Payer: Medicare Other | Attending: Pulmonary Disease | Admitting: Pulmonary Disease

## 2012-08-11 ENCOUNTER — Encounter (HOSPITAL_COMMUNITY): Payer: Self-pay | Admitting: *Deleted

## 2012-08-11 ENCOUNTER — Emergency Department (HOSPITAL_COMMUNITY): Payer: Medicare Other

## 2012-08-11 DIAGNOSIS — I10 Essential (primary) hypertension: Secondary | ICD-10-CM | POA: Diagnosis present

## 2012-08-11 DIAGNOSIS — K449 Diaphragmatic hernia without obstruction or gangrene: Secondary | ICD-10-CM | POA: Diagnosis present

## 2012-08-11 DIAGNOSIS — R1110 Vomiting, unspecified: Secondary | ICD-10-CM

## 2012-08-11 DIAGNOSIS — K311 Adult hypertrophic pyloric stenosis: Secondary | ICD-10-CM | POA: Diagnosis present

## 2012-08-11 DIAGNOSIS — F3289 Other specified depressive episodes: Secondary | ICD-10-CM | POA: Diagnosis present

## 2012-08-11 DIAGNOSIS — I2510 Atherosclerotic heart disease of native coronary artery without angina pectoris: Secondary | ICD-10-CM | POA: Diagnosis present

## 2012-08-11 DIAGNOSIS — F329 Major depressive disorder, single episode, unspecified: Secondary | ICD-10-CM | POA: Diagnosis present

## 2012-08-11 DIAGNOSIS — K219 Gastro-esophageal reflux disease without esophagitis: Secondary | ICD-10-CM | POA: Diagnosis present

## 2012-08-11 DIAGNOSIS — I080 Rheumatic disorders of both mitral and aortic valves: Secondary | ICD-10-CM | POA: Diagnosis present

## 2012-08-11 DIAGNOSIS — F419 Anxiety disorder, unspecified: Secondary | ICD-10-CM | POA: Diagnosis present

## 2012-08-11 DIAGNOSIS — R51 Headache: Secondary | ICD-10-CM | POA: Diagnosis present

## 2012-08-11 DIAGNOSIS — E785 Hyperlipidemia, unspecified: Secondary | ICD-10-CM | POA: Diagnosis present

## 2012-08-11 DIAGNOSIS — I350 Nonrheumatic aortic (valve) stenosis: Secondary | ICD-10-CM | POA: Diagnosis present

## 2012-08-11 DIAGNOSIS — Z66 Do not resuscitate: Secondary | ICD-10-CM | POA: Diagnosis present

## 2012-08-11 DIAGNOSIS — Z79899 Other long term (current) drug therapy: Secondary | ICD-10-CM

## 2012-08-11 DIAGNOSIS — Z7982 Long term (current) use of aspirin: Secondary | ICD-10-CM

## 2012-08-11 DIAGNOSIS — I340 Nonrheumatic mitral (valve) insufficiency: Secondary | ICD-10-CM | POA: Diagnosis present

## 2012-08-11 DIAGNOSIS — F411 Generalized anxiety disorder: Secondary | ICD-10-CM | POA: Diagnosis present

## 2012-08-11 DIAGNOSIS — K3189 Other diseases of stomach and duodenum: Secondary | ICD-10-CM

## 2012-08-11 DIAGNOSIS — K440 Diaphragmatic hernia with obstruction, without gangrene: Principal | ICD-10-CM | POA: Diagnosis present

## 2012-08-11 LAB — URINE MICROSCOPIC-ADD ON

## 2012-08-11 LAB — URINALYSIS, ROUTINE W REFLEX MICROSCOPIC
Glucose, UA: NEGATIVE mg/dL
Leukocytes, UA: NEGATIVE
Protein, ur: NEGATIVE mg/dL
Specific Gravity, Urine: 1.01 (ref 1.005–1.030)
pH: 6 (ref 5.0–8.0)

## 2012-08-11 LAB — COMPREHENSIVE METABOLIC PANEL
CO2: 31 mEq/L (ref 19–32)
Calcium: 9.8 mg/dL (ref 8.4–10.5)
Creatinine, Ser: 1.36 mg/dL — ABNORMAL HIGH (ref 0.50–1.10)
GFR calc Af Amer: 38 mL/min — ABNORMAL LOW (ref >90)
GFR calc non Af Amer: 33 mL/min — ABNORMAL LOW (ref >90)
Glucose, Bld: 108 mg/dL — ABNORMAL HIGH (ref 70–99)
Total Protein: 7.1 g/dL (ref 6.0–8.3)

## 2012-08-11 LAB — CBC WITH DIFFERENTIAL/PLATELET
Basophils Absolute: 0 10*3/uL (ref 0.0–0.1)
Eosinophils Absolute: 0 10*3/uL (ref 0.0–0.7)
Eosinophils Relative: 0 % (ref 0–5)
HCT: 35 % — ABNORMAL LOW (ref 36.0–46.0)
Lymphocytes Relative: 16 % (ref 12–46)
Lymphs Abs: 0.9 10*3/uL (ref 0.7–4.0)
MCH: 29.4 pg (ref 26.0–34.0)
MCV: 90.2 fL (ref 78.0–100.0)
Monocytes Absolute: 0.4 10*3/uL (ref 0.1–1.0)
Platelets: 281 10*3/uL (ref 150–400)
RDW: 13.4 % (ref 11.5–15.5)
WBC: 5.7 10*3/uL (ref 4.0–10.5)

## 2012-08-11 LAB — LACTIC ACID, PLASMA: Lactic Acid, Venous: 1.5 mmol/L (ref 0.5–2.2)

## 2012-08-11 MED ORDER — SODIUM CHLORIDE 0.9 % IV SOLN
1000.0000 mL | Freq: Once | INTRAVENOUS | Status: AC
  Administered 2012-08-11: 1000 mL via INTRAVENOUS

## 2012-08-11 MED ORDER — TRAVOPROST (BAK FREE) 0.004 % OP SOLN
1.0000 [drp] | Freq: Every day | OPHTHALMIC | Status: DC
  Filled 2012-08-11 (×10): qty 0.1

## 2012-08-11 MED ORDER — MIRTAZAPINE 15 MG PO TBDP
ORAL_TABLET | ORAL | Status: AC
  Filled 2012-08-11: qty 1

## 2012-08-11 MED ORDER — ALUM & MAG HYDROXIDE-SIMETH 200-200-20 MG/5ML PO SUSP: 30.0000 mL | Freq: Four times a day (QID) | ORAL | Status: DC | PRN

## 2012-08-11 MED ORDER — PANTOPRAZOLE SODIUM 40 MG IV SOLR
40.0000 mg | INTRAVENOUS | Status: DC
  Administered 2012-08-11: 40 mg via INTRAVENOUS
  Filled 2012-08-11: qty 40

## 2012-08-11 MED ORDER — ESCITALOPRAM OXALATE 10 MG PO TABS
20.0000 mg | ORAL_TABLET | Freq: Every day | ORAL | Status: DC
  Administered 2012-08-11: 20 mg via ORAL
  Filled 2012-08-11 (×5): qty 1

## 2012-08-11 MED ORDER — ONDANSETRON HCL 4 MG PO TABS: 4.0000 mg | ORAL_TABLET | Freq: Four times a day (QID) | ORAL | Status: DC | PRN

## 2012-08-11 MED ORDER — ENOXAPARIN SODIUM 30 MG/0.3ML ~~LOC~~ SOLN
30.0000 mg | SUBCUTANEOUS | Status: DC
  Administered 2012-08-11: 30 mg via SUBCUTANEOUS
  Filled 2012-08-11: qty 0.3

## 2012-08-11 MED ORDER — ALPRAZOLAM 0.25 MG PO TABS
0.2500 mg | ORAL_TABLET | Freq: Three times a day (TID) | ORAL | Status: DC
  Administered 2012-08-11 (×2): 0.25 mg via ORAL
  Filled 2012-08-11 (×2): qty 1

## 2012-08-11 MED ORDER — ACETAMINOPHEN 650 MG RE SUPP: 650.0000 mg | Freq: Four times a day (QID) | RECTAL | Status: DC | PRN

## 2012-08-11 MED ORDER — POTASSIUM CHLORIDE IN NACL 20-0.9 MEQ/L-% IV SOLN
INTRAVENOUS | Status: DC
  Administered 2012-08-11: 22:00:00 via INTRAVENOUS

## 2012-08-11 MED ORDER — CARVEDILOL 3.125 MG PO TABS
3.1250 mg | ORAL_TABLET | Freq: Two times a day (BID) | ORAL | Status: DC
  Administered 2012-08-11: 3.125 mg via ORAL
  Filled 2012-08-11 (×5): qty 1

## 2012-08-11 MED ORDER — ONDANSETRON HCL 4 MG/2ML IJ SOLN: 4.0000 mg | Freq: Four times a day (QID) | INTRAMUSCULAR | Status: DC | PRN

## 2012-08-11 MED ORDER — ONDANSETRON HCL 4 MG/2ML IJ SOLN
4.0000 mg | Freq: Once | INTRAMUSCULAR | Status: AC
  Administered 2012-08-11: 4 mg via INTRAVENOUS
  Filled 2012-08-11: qty 2

## 2012-08-11 MED ORDER — ACETAMINOPHEN 325 MG PO TABS: 650.0000 mg | ORAL_TABLET | Freq: Four times a day (QID) | ORAL | Status: DC | PRN

## 2012-08-11 MED ORDER — MIRTAZAPINE 15 MG PO TBDP
15.0000 mg | ORAL_TABLET | Freq: Every day | ORAL | Status: DC
  Administered 2012-08-11: 15 mg via ORAL
  Filled 2012-08-11 (×2): qty 1

## 2012-08-11 MED ORDER — SODIUM CHLORIDE 0.9 % IV SOLN
INTRAVENOUS | Status: DC
  Administered 2012-08-11: 75 mL/h via INTRAVENOUS

## 2012-08-11 NOTE — Consult Note (Addendum)
Referring Provider: No ref. provider found Primary Care Physician:  Fredirick Maudlin, MD Primary Gastroenterologist:  Dr. Karilyn Cota  Reason for Consultation:  Nausea and  vomiting  In a patient with large hiatal hernia; ? Gastric volvulus.  HPI: Patient is 79 year old Caucasian female who was admitted to Dr. Juanetta Gosling service earlier today via emergency room by Dr. Ouida Sills who is covering for Dr. Juanetta Gosling. Patient presented emergency room earlier today with nausea and vomiting since last night as well as right shoulder and left arm pain. NG tube was placed with resolution for pain as well as nausea and vomiting. Patient states she had 3 episodes of vomiting last night and vomitus was black. She did not see any bright red blood. She had normal stool this morning. She denies abdominal pain. She has noted nosebleed since NG tube was placed. Patient was admitted to [facility] on 07/18/2012 with more or less similar symptomatology. She was treated with IV fluids PPI and NG decompression. She was felt to have gastric outlet traction secondary to large hiatal hernia and physicians wondered if she had gastric volvulus. By the time she was discharged she was tolerating usual diet. She states she did well until yesterday. However she has had intermittent heartburn and difficulty swallowing. She denies melena or rectal bleeding. At home she has been on aspirin 81 mg by mouth daily and pantoprazole 20 mg by mouth twice a day. She does take OTC NSAIDs. Past history is negative for peptic ulcer disease. Patient states she has lost about 5 pounds since her last hospitalization 3 weeks ago. She had acute abdominal series in emergency room revealing most of this stomach and the chest with air fluid level and question of wall when this or outlet obstruction raised. Patient moved to [place] about 10 months ago and her niece Ms. Lynnell Dike Atkins helps in her care at home she lives alone. She does not drive  anymore. He used to live in [place]. All in all she worked for 50 years and most of her work at a [employer] She does not smoke cigarettes or drink alcohol. She has one brother age 100 who lives in [place].    Past Medical History  Diagnosis Date  . Coronary artery disease   . Hypercholesterolemia   . Hypertension   . Mitral insufficiency   . Aortic stenosis   . Hiatal hernia   . GERD (gastroesophageal reflux disease)   . History of stomach ulcers   . Chronic headaches   . Anxiety and depression     Past Surgical History  Procedure Laterality Date  . Abdominal hysterectomy      in her 30's  . Hip fracture surgery    . Shoulder surgery Right     Prior to Admission medications   Medication Sig Start Date End Date Taking? Authorizing Provider  ALPRAZolam (XANAX) 0.25 MG tablet Take 0.25 mg by mouth 3 (three) times daily.   Yes Historical Provider, MD  aspirin EC 81 MG tablet Take 81 mg by mouth daily.   Yes Historical Provider, MD  carvedilol (COREG) 3.125 MG tablet Take 3.125 mg by mouth 2 (two) times daily with a meal.   Yes Historical Provider, MD  escitalopram (LEXAPRO) 20 MG tablet Take 20 mg by mouth daily.   Yes Historical Provider, MD  mirtazapine (REMERON SOL-TAB) 15 MG disintegrating tablet Take 15 mg by mouth at bedtime.   Yes Historical Provider, MD  oxyCODONE-acetaminophen (PERCOCET/ROXICET) 5-325 MG per tablet Take 1 tablet  by mouth every 4 (four) hours as needed for pain.   Yes Historical Provider, MD  pantoprazole (PROTONIX) 40 MG tablet Take 40 mg by mouth daily.   Yes Historical Provider, MD  polyethylene glycol (MIRALAX / GLYCOLAX) packet Take 17 g by mouth daily.   Yes Historical Provider, MD  potassium chloride SA (K-DUR,KLOR-CON) 20 MEQ tablet Take 20 mEq by mouth daily.   Yes Historical Provider, MD  simvastatin (ZOCOR) 40 MG tablet Take 40 mg by mouth every evening.   Yes Historical Provider, MD  torsemide (DEMADEX) 100 MG tablet Take 100  mg by mouth daily.   Yes Historical Provider, MD  travoprost, benzalkonium, (TRAVATAN) 0.004 % ophthalmic solution Place 1 drop into both eyes at bedtime.   Yes Historical Provider, MD  cyclobenzaprine (FLEXERIL) 10 MG tablet Take 10 mg by mouth 3 (three) times daily as needed for muscle spasms.    Historical Provider, MD    Current Facility-Administered Medications  Medication Dose Route Frequency Provider Last Rate Last Dose  . 0.9 %  sodium chloride infusion   Intravenous Continuous Carylon Perches, MD 75 mL/hr at 08/11/12 1854 75 mL/hr at 08/11/12 1854  . acetaminophen (TYLENOL) tablet 650 mg  650 mg Oral Q6H PRN Carylon Perches, MD       Or  . acetaminophen (TYLENOL) suppository 650 mg  650 mg Rectal Q6H PRN Carylon Perches, MD      . ALPRAZolam Prudy Feeler) tablet 0.25 mg  0.25 mg Oral TID Carylon Perches, MD   0.25 mg at 08/11/12 1853  . alum & mag hydroxide-simeth (MAALOX/MYLANTA) 200-200-20 MG/5ML suspension 30 mL  30 mL Oral Q6H PRN Carylon Perches, MD      . carvedilol (COREG) tablet 3.125 mg  3.125 mg Oral BID WC Carylon Perches, MD   3.125 mg at 08/11/12 1853  . enoxaparin (LOVENOX) injection 30 mg  30 mg Subcutaneous Q24H Carylon Perches, MD   30 mg at 08/11/12 1853  . escitalopram (LEXAPRO) tablet 20 mg  20 mg Oral Daily Carylon Perches, MD   20 mg at 08/11/12 1852  . mirtazapine (REMERON SOL-TAB) disintegrating tablet 15 mg  15 mg Oral QHS Carylon Perches, MD      . ondansetron [facility]) tablet 4 mg  4 mg Oral Q6H PRN Carylon Perches, MD       Or  . ondansetron [facility]) injection 4 mg  4 mg Intravenous Q6H PRN Carylon Perches, MD      . pantoprazole (PROTONIX) injection 40 mg  40 mg Intravenous Q24H Carylon Perches, MD   40 mg at 08/11/12 1853  . travoprost (benzalkonium) (TRAVATAN) 0.004 % ophthalmic solution 1 drop  1 drop Both Eyes QHS Carylon Perches, MD        Allergies as of 08/11/2012 - Review Complete 08/11/2012  Allergen Reaction Noted  . Allegra (fexofenadine) Nausea And Vomiting 07/18/2012  . Codeine Nausea And Vomiting 07/18/2012  . Naproxen  Nausea Only 07/18/2012    No family history on file.  History   Social History  . Marital Status: Widowed    Spouse Name: N/A    Number of Children: N/A  . Years of Education: N/A   Occupational History  . Not on file.   Social History Main Topics  . Smoking status: Never Smoker   . Smokeless tobacco: Not on file  . Alcohol Use: No  . Drug Use: No  . Sexually Active: Not on file   Other Topics Concern  . Not on file  Social History Narrative   Patient is in [place] by herself.   Her niece visits her every day to help her with groceries and other stuff.   Overall she is independent.    Review of Systems: See HPI, otherwise normal ROS  Physical Exam: Temp:  [98 F (36.7 C)-98.1 F (36.7 C)] 98.1 F (36.7 C) (03/15 1702) Pulse Rate:  [63-90] 90 (03/15 1702) Resp:  [18-24] 24 (03/15 1702) BP: (113-124)/(58-74) 113/74 mmHg (03/15 1702) SpO2:  [92 %-95 %] 92 % (03/15 1702) Weight:  [134 lb 14.7 oz (61.2 kg)-138 lb (62.596 kg)] 134 lb 14.7 oz (61.2 kg) (03/15 1702)   General:   Alert,  Well-developed, well-nourished, pleasant and cooperative in NAD Head:  Normocephalic and atraumatic. Eyes:  Sclera clear, no icterus.   Conjunctiva pink. Mouth:  Oropharyngeal mucosa is normal.  She is edentulous. Neck:  Supple; no masses or thyromegaly. Lungs:  Clear throughout to auscultation.   No wheezes, crackles, or rhonchi. No acute distress. Heart:  Regular rate and rhythm; no murmurs, loud SEM noted all over the precordium. Abdomen:  Soft, nontender and nondistended. No masses, hepatosplenomegaly or hernias noted. Normal bowel sounds, without guarding, and without rebound.     Extremities:  Without clubbing or edema. Neurologic:  Alert and  oriented x4;  grossly normal neurologically. Skin:  Intact without significant lesions or rashes.      Lab Results:  Recent Labs  08/11/12 1329  WBC 5.7  HGB 11.4*  HCT 35.0*  PLT 281   BMET  Recent Labs   08/11/12 1329  NA 140  K 3.7  CL 97  CO2 31  GLUCOSE 108*  BUN 27*  CREATININE 1.36*  CALCIUM 9.8   LFT  Recent Labs  08/11/12 1329  PROT 7.1  ALBUMIN 3.8  AST 17  ALT 8  ALKPHOS 113  BILITOT 0.3   PT/INR No results found for this basename: LABPROT, INR,  in the last 72 hours Hepatitis Panel No results found for this basename: HEPBSAG, HCVAB, HEPAIGM, HEPBIGM,  in the last 72 hours  Studies/Results: Dg Abd Acute W/chest  08/11/2012  *RADIOLOGY REPORT*  Clinical Data: Chest and abdominal pain.  History of gastric volvulus and obstruction.  ACUTE ABDOMEN SERIES (ABDOMEN 2 VIEW & CHEST 1 VIEW)  Comparison: 07/18/2012 radiograph and CT  Findings: An extremely large portion of the stomach is intrathoracic with a air fluid level.  As on the previous study, this probably represents a gastric volvulus. The cardiomediastinal silhouette is otherwise stable. No definite airspace disease, pleural effusion or pneumothorax noted. Nondistended gas-filled loops of small bowel and colon noted. There is no evidence of pneumoperitoneum.  IMPRESSION: Extremely large portion of the stomach lies intrathoracic with air fluid level likely representing a gastric volvulus and possible obstruction.  No other acute abnormalities identified.   Original Report Authenticated By: Harmon Pier, M.D.    I have also reviewed her chest and abdominopelvic CT from 07/18/2012.  Assessment; Patient is a pleasant 79 year old Caucasian female who presents with acute onset of coffee-ground emesis and noted to have most of her stomach in her chest with air fluid level. She is admitted to [facility] about 3 weeks ago with similar symptomatology and responded to NG decompression. She underwent chest and abdominopelvic CT but did not undergo endoscopic evaluation. After review of her chest and abdominopelvic CT and acute abdominal series from today I believe we are dealing with gastric outlet obstruction. I do  not believe she  has gastric volvulus. I would be concerned that she could have pyloric stenosis either due to benign or malignant disease. Since her symptoms have relapsed within 3 weeks the first episode she needs to be evaluated while at esophagogastroduodenoscopy. I have reviewed the procedure with the patient I will also talk with her niece Ms. [place] and they're both agreeable. Agree with IV PPI therapy and NG decompression.  Recommendations;  Hold Lovenox for now. Diagnostic esophagogastroduodenoscopy in a.m. Further recommendations will depend on endoscopic findings. Condition and recommendations reviewed with Dr. Ouida Sills.   LOS: 0 days   REHMAN,NAJEEB U  08/11/2012, 8:01 PM

## 2012-08-11 NOTE — ED Notes (Signed)
Vomiting that started last night.  Family reports black emesis.   C/o mid chest pain.  Also c/o abd pain.

## 2012-08-11 NOTE — ED Provider Notes (Signed)
History     This chart was scribed for Gerhard Munch, MD, MD by Smitty Pluck, ED Scribe. The patient was seen in room APA07/APA07 and the patient's care was started at 1:14 PM.   CSN: [id number]  Arrival date & time 08/11/12  1241      Chief Complaint  Patient presents with  . Emesis     The history is provided by the patient, a relative and medical records. No language interpreter was used.   [name] is a 79 y.o. female mitral insufficiency, CAD, HTN and aortic stenosis who presents to the Emergency Department complaining of emesis onset 1 day ago. Pt's vomit is black in color. She mentions having choking sensation. Pt has not been able to tolerate food and liquids since last night due to nausea. She reports having chest discomfort most c/w choking sensation, and no exertional or pleuritic pain. Pt was at [facility] for bowel obstruction due to hernia discharged from [facility] on 07-18-12. Her last bowel movement was 1 day ago. Pt denies trouble speaking, visual disturbance, fever, chills, diarrhea, weakness, cough, SOB and any other pain.   Past Medical History  Diagnosis Date  . Coronary artery disease   . Hypercholesterolemia   . Hypertension   . Mitral insufficiency   . Aortic stenosis   . Hiatal hernia   . GERD (gastroesophageal reflux disease)   . History of stomach ulcers   . Chronic headaches   . Anxiety and depression     Past Surgical History  Procedure Laterality Date  . Abdominal hysterectomy      in her 30's  . Hip fracture surgery    . Shoulder surgery Right     No family history on file.  History  Substance Use Topics  . Smoking status: Never Smoker   . Smokeless tobacco: Not on file  . Alcohol Use: No    OB History   Grav Para Term Preterm Abortions TAB SAB Ect Mult Living                  Review of Systems  Constitutional:       Per HPI, otherwise negative  HENT:       Per HPI, otherwise negative  Respiratory:       Per HPI,  otherwise negative  Cardiovascular: Positive for chest pain.       Per HPI, otherwise negative  Gastrointestinal: Positive for nausea and vomiting.  Endocrine:       Negative aside from HPI  Genitourinary:       Neg aside from HPI   Musculoskeletal:       Per HPI, otherwise negative  Skin: Negative.   Neurological: Negative for syncope.  All other systems reviewed and are negative.    Allergies  Allegra; Codeine; and Naproxen  Home Medications   Current Outpatient Rx  Name  Route  Sig  Dispense  Refill  . aspirin 81 MG tablet   Oral   Take 81 mg by mouth daily.         . carvedilol (COREG) 3.125 MG tablet   Oral   Take 3.125 mg by mouth 2 (two) times daily with a meal.         . cyclobenzaprine (FLEXERIL) 10 MG tablet   Oral   Take 10 mg by mouth 3 (three) times daily as needed for muscle spasms.         Marland Kitchen escitalopram (LEXAPRO) 20 MG tablet  Oral   Take 20 mg by mouth daily.         Marland Kitchen LORazepam (ATIVAN) 1 MG tablet   Oral   Take 1 mg by mouth every 12 (twelve) hours.         . mirtazapine (REMERON SOL-TAB) 15 MG disintegrating tablet   Oral   Take 15 mg by mouth at bedtime.         Marland Kitchen oxyCODONE-acetaminophen (PERCOCET/ROXICET) 5-325 MG per tablet   Oral   Take 1 tablet by mouth every 4 (four) hours as needed for pain.         . pantoprazole (PROTONIX) 40 MG tablet   Oral   Take 40 mg by mouth daily.         . potassium chloride SA (K-DUR,KLOR-CON) 20 MEQ tablet   Oral   Take 20 mEq by mouth daily.         . simvastatin (ZOCOR) 40 MG tablet   Oral   Take 40 mg by mouth every evening.         . torsemide (DEMADEX) 100 MG tablet   Oral   Take 100 mg by mouth daily.         . travoprost, benzalkonium, (TRAVATAN) 0.004 % ophthalmic solution   Both Eyes   Place 1 drop into both eyes at bedtime.         Marland Kitchen zolpidem (AMBIEN) 5 MG tablet   Oral   Take 5 mg by mouth at bedtime as needed for sleep.           BP 113/58  Pulse  63  Temp(Src) 98 F (36.7 C) (Oral)  Resp 18  Ht 5\' 3"  (1.6 m)  Wt 138 lb (62.596 kg)  BMI 24.45 kg/m2  SpO2 95%  Physical Exam  Nursing note and vitals reviewed. Constitutional: She is oriented to person, place, and time. She appears well-developed and well-nourished. No distress.  HENT:  Head: Normocephalic and atraumatic.  Eyes: Conjunctivae are normal.  Cardiovascular: Normal rate and regular rhythm.   Murmur heard. Pulmonary/Chest: Breath sounds normal.  Abdominal: There is tenderness in the epigastric area.  Musculoskeletal: She exhibits no edema.  Neurological: She is alert and oriented to person, place, and time.  Skin: Skin is warm and dry.  Psychiatric: She has a normal mood and affect. Her behavior is normal.    ED Course  Procedures (including critical care time) DIAGNOSTIC STUDIES:  Oxygen Saturation is 95% on room air, adequate by my interpretation.      COORDINATION OF CARE: 1:19 PM Discussed ED treatment with pt and pt agrees.     Labs Reviewed  CBC WITH DIFFERENTIAL - Abnormal; Notable for the following:    Hemoglobin 11.4 (*)    HCT 35.0 (*)    All other components within normal limits  COMPREHENSIVE METABOLIC PANEL - Abnormal; Notable for the following:    Glucose, Bld 108 (*)    BUN 27 (*)    Creatinine, Ser 1.36 (*)    GFR calc non Af Amer 33 (*)    GFR calc Af Amer 38 (*)    All other components within normal limits  URINALYSIS, ROUTINE W REFLEX MICROSCOPIC - Abnormal; Notable for the following:    Hgb urine dipstick MODERATE (*)    All other components within normal limits  LIPASE, BLOOD  LACTIC ACID, PLASMA  URINE MICROSCOPIC-ADD ON   Dg Abd Acute W/chest  08/11/2012  *RADIOLOGY REPORT*  Clinical Data: Chest and abdominal  pain.  History of gastric volvulus and obstruction.  ACUTE ABDOMEN SERIES (ABDOMEN 2 VIEW & CHEST 1 VIEW)  Comparison: 07/18/2012 radiograph and CT  Findings: An extremely large portion of the stomach is intrathoracic  with a air fluid level.  As on the previous study, this probably represents a gastric volvulus. The cardiomediastinal silhouette is otherwise stable. No definite airspace disease, pleural effusion or pneumothorax noted. Nondistended gas-filled loops of small bowel and colon noted. There is no evidence of pneumoperitoneum.  IMPRESSION: Extremely large portion of the stomach lies intrathoracic with air fluid level likely representing a gastric volvulus and possible obstruction.  No other acute abnormalities identified.   Original Report Authenticated By: Harmon Pier, M.D.    I  Reviewed and interpreted the XR  No diagnosis found.  After the initial evaluation, I reviewed the patient's chart, and discussed her case with the EDP who admitted the patient last month.  Update: Patient remains in no distress. I discussed her case with her PMD's colleague.    MDM  I personally performed the services described in this documentation, which was scribed in my presence. The recorded information has been reviewed and is accurate.  This patient presents with abdominal/chest pain, by mouth intolerance, anorexia.  Notably, the patient has a history of hiatal hernia, was recently hospitalized due to a similar presentation.  On my exam the patient is afebrile, in no distress, though she is mild tenderness to palpation.  Given this absence of distress, the reassuring labs, the patient was admitted here for further evaluation and management after placement of an an NG tube.  Today's radiographic studies are similar to those she had one month ago, which was also admitted to a general medicine service with surgery consultation.  At that point patient is not felt to be a good surgical candidate pending additional GI evaluation.  Given the absence of distress today, general medicine admission with similar considerations is appropriate.  Gerhard Munch, MD 08/11/12 2144

## 2012-08-11 NOTE — ED Notes (Signed)
Report called. Pt's IV infiltrated. Receiving nurse aware and stated she would restart IV.

## 2012-08-12 ENCOUNTER — Encounter (HOSPITAL_COMMUNITY)
Admission: EM | Disposition: A | Discharge status: Other Acute Inpatient Hospital | Payer: Self-pay | Source: Home / Self Care | Attending: Pulmonary Disease

## 2012-08-12 DIAGNOSIS — R112 Nausea with vomiting, unspecified: Secondary | ICD-10-CM

## 2012-08-12 DIAGNOSIS — K920 Hematemesis: Secondary | ICD-10-CM

## 2012-08-12 DIAGNOSIS — K319 Disease of stomach and duodenum, unspecified: Secondary | ICD-10-CM

## 2012-08-12 DIAGNOSIS — K449 Diaphragmatic hernia without obstruction or gangrene: Secondary | ICD-10-CM

## 2012-08-12 HISTORY — PX: ESOPHAGOGASTRODUODENOSCOPY: SHX5428

## 2012-08-12 LAB — CBC
MCHC: 31.9 g/dL (ref 30.0–36.0)
RDW: 13.6 % (ref 11.5–15.5)
WBC: 5.8 10*3/uL (ref 4.0–10.5)

## 2012-08-12 LAB — BASIC METABOLIC PANEL
BUN: 21 mg/dL (ref 6–23)
Creatinine, Ser: 1.23 mg/dL — ABNORMAL HIGH (ref 0.50–1.10)
GFR calc Af Amer: 43 mL/min — ABNORMAL LOW (ref >90)
GFR calc non Af Amer: 37 mL/min — ABNORMAL LOW (ref >90)
Potassium: 3.3 mEq/L — ABNORMAL LOW (ref 3.5–5.1)

## 2012-08-12 SURGERY — EGD (ESOPHAGOGASTRODUODENOSCOPY)
Anesthesia: Moderate Sedation

## 2012-08-12 MED ORDER — STERILE WATER FOR IRRIGATION IR SOLN
Status: DC | PRN
  Administered 2012-08-12: 08:00:00

## 2012-08-12 MED ORDER — MEPERIDINE HCL 50 MG/ML IJ SOLN
INTRAMUSCULAR | Status: AC
  Filled 2012-08-12: qty 1

## 2012-08-12 MED ORDER — MEPERIDINE HCL 25 MG/ML IJ SOLN
INTRAMUSCULAR | Status: DC | PRN
  Administered 2012-08-12: 15 mg via INTRAVENOUS

## 2012-08-12 MED ORDER — MIDAZOLAM HCL 5 MG/5ML IJ SOLN
INTRAMUSCULAR | Status: AC
  Filled 2012-08-12: qty 5

## 2012-08-12 MED ORDER — BUTAMBEN-TETRACAINE-BENZOCAINE 2-2-14 % EX AERO
INHALATION_SPRAY | CUTANEOUS | Status: DC | PRN
  Administered 2012-08-12: 2 via TOPICAL

## 2012-08-12 MED ORDER — MIDAZOLAM HCL 5 MG/5ML IJ SOLN
INTRAMUSCULAR | Status: DC | PRN
  Administered 2012-08-12 (×3): 1 mg via INTRAVENOUS

## 2012-08-12 NOTE — Progress Notes (Signed)
To help from Dr. Karilyn Cota is noted and appreciated. Plans are for her to be transferred to [facility] for definitive treatment. I discussed this with Ms. Sharron and her daughter. She is comfortable now.

## 2012-08-12 NOTE — H&P (Signed)
NAMEBABBIE, DONDLINGER               ACCOUNT NO.:  [id number]  MEDICAL RECORD NO.:  [id number]  LOCATION:  A340                          FACILITY:  [facility]  PHYSICIAN:  Kingsley Callander. Ouida Sills, MD       DATE OF BIRTH:  [birth year]  DATE OF ADMISSION:  08/11/2012 DATE OF DISCHARGE:  03/16/2014LH                             HISTORY & PHYSICAL   CHIEF COMPLAINT:  Vomiting.  HISTORY OF PRESENT ILLNESS:  This patient is a 79 year old white female, patient of Dr. Juanetta Gosling who presented to the emergency room after developing abdominal pain over the past day with associated vomiting of black coffee-ground material.  The patient was brought to the emergency room by her power of attorney.  The patient was hospitalized last month at [facility] with gastric outlet obstruction related to enlarged hiatal hernia.  She responded to conservative therapy.  Arrangements had been made for further Gastroenterology followup as an outpatient.  The patient had not had recurrent vomiting or pain until yesterday.  She last vomited approximately 10 hours prior to my examination.  She was evaluated in the emergency room where plain films revealed a large hiatal hernia with what appeared to be the majority of her stomach in her intrathoracic area with an air-fluid level present.  A nasogastric tube was placed and brown fluid was drained.  The patient has been hemodynamically stable.  She has not had fever.  She states she has had bowel movement 1 day prior to admission.  There was no melena or bright red rectal bleeding.  She has not had bright red hematemesis.  She states she has been able to eat fairly well in the morning and over the last few weeks, but in the evening she begins to experience choking sensations when she tries to eat.  PAST MEDICAL HISTORY: 1. Hiatal hernia. 2. Aortic stenosis. 3. Hypertension. 4. Hyperlipidemia. 5. Mitral regurgitation. 6. Headaches. 7. Depression and anxiety.  PAST SURGICAL HISTORY:   Hysterectomy, right hip surgery, and right shoulder surgery.  MEDICATIONS: 1. Alprazolam 0.25 mg t.i.d. 2. Aspirin 81 mg daily. 3. Carvedilol 3.125 mg b.i.d. 4. Lexapro 20 mg daily. 5. Remeron 15 mg daily. 6. Percocet 5/325 q.4 p.r.n. 7. Protonix 40 mg daily. 8. MiraLAX 17 g daily. 9. Potassium 20 mEq daily. 10.Simvastatin 40 mg daily. 11.Demadex 100 mg daily. 12.Travatan 0.004% ophthalmic solution, 1 drop in both eyes at     bedtime. 13.Flexeril 10 mg t.i.d. p.r.n.  ALLERGIES:  ALLEGRA, CODEINE, and NAPROXEN.  SOCIAL HISTORY:  She does not smoke, drink, or use recreational substances.  REVIEW OF SYSTEMS:  No false syncope.  Difficulty breathing.  PHYSICAL EXAMINATION:  VITAL SIGNS:  Temperature 97.6, pulse 63, respirations 18, blood pressure 104/53. GENERAL:  Alert and in no distress. HEENT:  No scleral icterus.  Nose and oropharynx are unremarkable. NECK:  No JVD or thyromegaly. LUNGS:  Clear. HEART:  Regular with a grade 3 systolic murmur radiating throughout the chest and abdomen. ABDOMEN:  Soft and nondistended without significant tenderness. EXTREMITIES:  No cyanosis, clubbing, or edema. NEURO:  No focal weakness. LYMPH NODES:  No cervical or supraclavicular enlargement. SKIN:  Warm and dry.  LABORATORY DATA:  White count 5.7, hemoglobin 11.4, platelets 281,000. Sodium 140, potassium 3.7, bicarb 31, BUN 27, creatinine 1.36.  Calcium 9.8, lactic acid 1.5.  Lipase 40, AST 17, ALT 8, albumin 3.8, glucose 108.  Acute abdominal series reveals an extremely large portion of the stomach lying in the intrathoracic position with an air-fluid level likely representing gastric volvulus and possible obstruction.  IMPRESSION/PLAN: 1. Large hiatal hernia with possible gastric outlet obstruction and     volvulus.  Consult Gastroenterology.  Her case was discussed with     Dr. Karilyn Cota.  Continue nasogastric suction.  Continue IV Protonix. 2. Aortic stenosis. 3. Probable  dehydration.  BUN and creatinine are elevated at 27 and     1.36 compared to 16 and 1.20 last month. 4. Hypertension. 5. Hyperlipidemia. 6. Depression and anxiety. 7. Mitral regurgitation. 8. Code status is DNR.    Kingsley Callander. Ouida Sills, MD    ROF/MEDQ  D:  08/12/2012  T:  08/12/2012  Job:  [id number]

## 2012-08-12 NOTE — Op Note (Signed)
EGD PROCEDURE REPORT  PATIENT:  [name]  MR#:  [id number] Birthdate:  1920-02-10, 79 y.o., female Endoscopist:  Dr. Malissa Hippo, MD Referred By:  Dr. Carylon Perches, MD Procedure Date: 08/12/2012  Procedure:   EGD  Indications:  Patient is 79 year-old Caucasian female who presents with vomiting and coffee-ground emesis. She has history of large heart hernia and was recently hospitalized at [facility] and responded to NG suction.            Informed Consent:  The risks, benefits, alternatives & imponderables which include, but are not limited to, bleeding, infection, perforation, drug reaction and potential missed lesion have been reviewed.  The potential for biopsy, lesion removal, esophageal dilation, etc. have also been discussed.  Questions have been answered.  All parties agreeable.  Please see history & physical in medical record for more information.  Medications:  Demerol 15 mg IV Versed 3 mg IV Cetacaine spray topically for oropharyngeal anesthesia  Description of procedure:  The endoscope was introduced through the mouth and advanced to the second portion of the duodenum without difficulty or limitations. The mucosal surfaces were surveyed very carefully during advancement of the scope and upon withdrawal. NG tube was removed before scope could be inserted.  Findings:  Esophagus:  There was mucosal edema and erythema at GE junction otherwise normal mucosa of the esophagus. GEJ:  30 cm Hiatus:  40 cm Stomach:  Most of the stomach was in the chest hiatus at 40 cm from the incisors with narrow segment leading into small distal pouch or antrum. Gastric fluid noted in the proximal segment but none in the distal. Pyloric channel was patent. Scope was easily retroflexed no abnormality noted to gastric fundus and cardia. Duodenum:  Normal bulbar and post bulbar mucosa.  Therapeutic/Diagnostic Maneuvers Performed:  None  Complications:  None  Impression: Large  hiatal hernia with organoaxial rotation and relative obstruction at the of hiatus. Most of the stomach is intrathoracic. Mild changes of reflux esophagitis limited to GE junction but no evidence of pyloric stenosis or pyloric channel ulcer.  Recommendations:  Surgical consultation for laparoscopic partial or complete reduction of hernia with gastropexy which may prevent organo-axial rotation and obstruction. Ms. [name] patient's niece and power of attorney would like for her to be transferred to [facility].  Kierah Goatley U  08/12/2012  8:38 AM  CC: Dr. Fredirick Maudlin, MD & Dr. Bonnetta Barry ref. provider found

## 2012-08-12 NOTE — Op Note (Signed)
Dr Karilyn Cota removed NG tube prior to begining EGD procedure.

## 2012-08-12 NOTE — Discharge Summary (Signed)
Physician Discharge Summary  Patient ID: [name] MRN: [id number] DOB/AGE: 02-04-1920 79 y.o. Primary Care Physician:Ellison Rieth L, MD Admit date: 08/11/2012 Discharge date: 08/12/2012    Discharge Diagnoses:   Active Problems:   Gastric distention   Stenosis of aortic valve   CAD (coronary artery disease)   GERD (gastroesophageal reflux disease)   Hiatal hernia   HTN (hypertension)   Mitral insufficiency   Anxiety and depression   Gastric outlet obstruction     Medication List    STOP taking these medications       ALPRAZolam 0.25 MG tablet  Commonly known as:  XANAX     aspirin EC 81 MG tablet     carvedilol 3.125 MG tablet  Commonly known as:  COREG     cyclobenzaprine 10 MG tablet  Commonly known as:  FLEXERIL     escitalopram 20 MG tablet  Commonly known as:  LEXAPRO     mirtazapine 15 MG disintegrating tablet  Commonly known as:  REMERON SOL-TAB     oxyCODONE-acetaminophen 5-325 MG per tablet  Commonly known as:  PERCOCET/ROXICET     pantoprazole 40 MG tablet  Commonly known as:  PROTONIX     polyethylene glycol packet  Commonly known as:  MIRALAX / GLYCOLAX     potassium chloride SA 20 MEQ tablet  Commonly known as:  K-DUR,KLOR-CON     simvastatin 40 MG tablet  Commonly known as:  ZOCOR     torsemide 100 MG tablet  Commonly known as:  DEMADEX     travoprost (benzalkonium) 0.004 % ophthalmic solution  Commonly known as:  TRAVATAN        Discharged Condition: Improved    Consults: Gastroenterology  Significant Diagnostic Studies: Ct Chest W Contrast  07/18/2012  **ADDENDUM** CREATED: 07/18/2012 21:53:41  Air fluid collection along the posterior margin of the duodenojejunal junction without surrounding inflammation is favored to reflect a prominent diverticulum.  **END ADDENDUM** SIGNED BY: Waneta Martins, M.D.   07/18/2012  *RADIOLOGY REPORT*  Clinical Data:  Epigastric pain, chest pain.  History of hiatal hernia.  CT CHEST,  ABDOMEN AND PELVIS WITH CONTRAST  Technique:  Multidetector CT imaging of the chest, abdomen and pelvis was performed following the standard protocol during bolus administration of intravenous contrast.  Contrast: 1 OMNIPAQUE IOHEXOL 300 MG/ML  SOLN, 80mL OMNIPAQUE IOHEXOL 300 MG/ML  SOLN .  Comparison:  07/18/2012 radiograph  CT CHEST  Findings:  Enlarged thyroid with multiple nodules of varying complexity.  Normal caliber aorta.  Heart displaced anteriorly by the large the hiatal hernia.  There is organoaxial rotation.  The distal stomach and small bowel loops are decompressed.  No lymphadenopathy.  Central airways are patent.  Mild peripheral reticular opacities. Mild bibasilar atelectasis and trace effusions. Mild areas of mosaic attenuation/atelectasis versus air trapping.  Osteopenia.  Right shoulder hardware.  Exaggerated kyphosis.  IMPRESSION: Markedly distended stomach, partially intrathoracic, with organoaxial rotation.  Given the distal decompression, gastric volvulus is of concern.  Complex left thyroid lobe nodules.  If clinically warranted, consider ultrasound.  CT ABDOMEN AND PELVIS  Findings:  A few hepatic hypodensities are nonspecific, favor cysts.  Unremarkable biliary system, spleen, adrenal glands.  Mild dilatation of the pancreatic duct within the head/neck.  In addition, there is dilatation of the side branch versus accessory duct along the head/uncinate process.  Unremarkable adrenal glands.  Bilateral renal cysts and incompletely characterized hypodensities. No hydronephrosis or hydroureter.  Colonic diverticulosis.  The colon and small  bowel are essentially decompressed.  There is mild stranding of the small bowel root mesentery.  No lymphadenopathy.  Normal caliber aorta and branch vessels.  Left adnexal cyst has a nonaggressive appearance.  Thin-walled bladder.  Absent uterus.  Partially imaged right hip hardware.  Diffuse osteopenia and multilevel degenerative change.  T12 hemangioma.   Minimal anterolisthesis of L4 on L5.  IMPRESSION: Decompressed small bowel and colon. See chest report above.  Discussed via telephone with Dr. Judd Lien at 04:15 a.m. on 07/18/2012.  Original Report Authenticated By: Jearld Lesch, M.D.    Ct Abdomen Pelvis W Contrast  07/18/2012  **ADDENDUM** CREATED: 07/18/2012 21:53:41  Air fluid collection along the posterior margin of the duodenojejunal junction without surrounding inflammation is favored to reflect a prominent diverticulum.  **END ADDENDUM** SIGNED BY: Waneta Martins, M.D.   07/18/2012  *RADIOLOGY REPORT*  Clinical Data:  Epigastric pain, chest pain.  History of hiatal hernia.  CT CHEST, ABDOMEN AND PELVIS WITH CONTRAST  Technique:  Multidetector CT imaging of the chest, abdomen and pelvis was performed following the standard protocol during bolus administration of intravenous contrast.  Contrast: 1 OMNIPAQUE IOHEXOL 300 MG/ML  SOLN, 80mL OMNIPAQUE IOHEXOL 300 MG/ML  SOLN .  Comparison:  07/18/2012 radiograph  CT CHEST  Findings:  Enlarged thyroid with multiple nodules of varying complexity.  Normal caliber aorta.  Heart displaced anteriorly by the large the hiatal hernia.  There is organoaxial rotation.  The distal stomach and small bowel loops are decompressed.  No lymphadenopathy.  Central airways are patent.  Mild peripheral reticular opacities. Mild bibasilar atelectasis and trace effusions. Mild areas of mosaic attenuation/atelectasis versus air trapping.  Osteopenia.  Right shoulder hardware.  Exaggerated kyphosis.  IMPRESSION: Markedly distended stomach, partially intrathoracic, with organoaxial rotation.  Given the distal decompression, gastric volvulus is of concern.  Complex left thyroid lobe nodules.  If clinically warranted, consider ultrasound.  CT ABDOMEN AND PELVIS  Findings:  A few hepatic hypodensities are nonspecific, favor cysts.  Unremarkable biliary system, spleen, adrenal glands.  Mild dilatation of the pancreatic duct within the  head/neck.  In addition, there is dilatation of the side branch versus accessory duct along the head/uncinate process.  Unremarkable adrenal glands.  Bilateral renal cysts and incompletely characterized hypodensities. No hydronephrosis or hydroureter.  Colonic diverticulosis.  The colon and small bowel are essentially decompressed.  There is mild stranding of the small bowel root mesentery.  No lymphadenopathy.  Normal caliber aorta and branch vessels.  Left adnexal cyst has a nonaggressive appearance.  Thin-walled bladder.  Absent uterus.  Partially imaged right hip hardware.  Diffuse osteopenia and multilevel degenerative change.  T12 hemangioma.  Minimal anterolisthesis of L4 on L5.  IMPRESSION: Decompressed small bowel and colon. See chest report above.  Discussed via telephone with Dr. Judd Lien at 04:15 a.m. on 07/18/2012.  Original Report Authenticated By: Jearld Lesch, M.D.    Dg Chest Port 1 View  07/18/2012  *RADIOLOGY REPORT*  Clinical Data: Evaluate nasogastric tube position  PORTABLE CHEST - 1 VIEW  Comparison: Recent CT chest abdomen pelvis 07/18/2012  Findings: Interval placement of a nasogastric tube.  Nasogastric tube is coiled in the mid thorax, likely within the intrathoracic portion of the massive hiatal hernia.  Otherwise, unchanged appearance of the chest.  IMPRESSION: The nasogastric tube is coiled within the intrathoracic portion of the stomach.   Original Report Authenticated By: Malachy Moan, M.D.    Dg Chest Port 1 View  07/18/2012  *RADIOLOGY REPORT*  Clinical Data: Chest and epigastric pain  PORTABLE CHEST - 1 VIEW  Comparison: Prior chest x-ray 04/16/2007  Findings: Compared to the remote prior study, there is been a marked increase in the size of the hiatal hernia which now splays the carina.  The cardiopericardial silhouette is largely obscured by the large intrathoracic hiatal hernia.  Atherosclerotic calcifications noted in the transverse aorta.  Chronic central bronchitic  changes and prominence of interstitial markings similar to prior.  No acute osseous abnormality.  Prior ORIF of a right humeral neck fracture.  IMPRESSION:  1.  Marked enlargement of massive hiatal hernia compared to the prior chest x-ray from November 2008. 2.  No definite acute cardiopulmonary disease although the large hiatal hernia obscures the majority of the thorax.   Original Report Authenticated By: Malachy Moan, M.D.    Dg Abd Acute W/chest  08/11/2012  *RADIOLOGY REPORT*  Clinical Data: Chest and abdominal pain.  History of gastric volvulus and obstruction.  ACUTE ABDOMEN SERIES (ABDOMEN 2 VIEW & CHEST 1 VIEW)  Comparison: 07/18/2012 radiograph and CT  Findings: An extremely large portion of the stomach is intrathoracic with a air fluid level.  As on the previous study, this probably represents a gastric volvulus. The cardiomediastinal silhouette is otherwise stable. No definite airspace disease, pleural effusion or pneumothorax noted. Nondistended gas-filled loops of small bowel and colon noted. There is no evidence of pneumoperitoneum.  IMPRESSION: Extremely large portion of the stomach lies intrathoracic with air fluid level likely representing a gastric volvulus and possible obstruction.  No other acute abnormalities identified.   Original Report Authenticated By: Harmon Pier, M.D.    Dg Abd Portable 1v  07/18/2012  *RADIOLOGY REPORT*  Clinical Data: NG tube placement.  PORTABLE ABDOMEN - 1 VIEW  Comparison: Chest CT 07/18/2012.  Findings: The NG tube is barely visualized at the very top of the film.  It appears to be in the intrathoracic portion of the stomach.  IMPRESSION: NG tube appears to be in the intrathoracic portion of the stomach. A chest x-ray is recommended for confirmation.   Original Report Authenticated By: Rudie Meyer, M.D.     Lab Results: Basic Metabolic Panel:  Recent Labs  66/06/30 1329 08/12/12 0526  NA 140 142  K 3.7 3.3*  CL 97 104  CO2 31 29  GLUCOSE 108*  88  BUN 27* 21  CREATININE 1.36* 1.23*  CALCIUM 9.8 8.8   Liver Function Tests:  Recent Labs  08/11/12 1329  AST 17  ALT 8  ALKPHOS 113  BILITOT 0.3  PROT 7.1  ALBUMIN 3.8     CBC:  Recent Labs  08/11/12 1329 08/12/12 0526  WBC 5.7 5.8  NEUTROABS 4.3  --   HGB 11.4* 9.5*  HCT 35.0* 29.8*  MCV 90.2 92.8  PLT 281 214    No results found for this or any previous visit (from the past 240 hour(s)).   Hospital Course: She was admitted to the hospital with nausea and vomiting and abdominal and chest pain. She had been in the hospital in [place] with a similar problem and was found to have a very large hiatal hernia and what appeared to be some gastric outlet obstruction because of portion of that hernia. It was felt that she probably had a recurrence of that problem. Consultation was obtained with GI and she underwent an endoscopy and her symptoms were improved it was felt that this was going to require surgery. She is being transferred to [facility]  at family's request  Discharge Exam: Blood pressure 92/56, pulse 73, temperature 97.8 F (36.6 C), temperature source Oral, resp. rate 24, height 5' (1.524 m), weight 61.2 kg (134 lb 14.7 oz), SpO2 95.00%. She is awake and alert. She looks comfortable.  Disposition: Transfer to [facility] when bed is available      Discharge Orders   Future Appointments Provider Department Dept Phone   08/16/2012 11:00 AM Len Blalock, NP [facility] [phone]   Future Orders Complete By Expires     Discharge patient  As directed     Comments:      To [facility] when bed available         Signed: Fredirick Maudlin Pager [phone]  08/12/2012, 10:58 AM

## 2012-08-12 NOTE — Progress Notes (Signed)
1200 Report given to Bonnye Fava, Paramedic from the [facility]  He verbalized understanding.  I updated him again once he go to the unit: recent VS and that the patient had gotten up to the [facility] to urinate.  I asked him to pass this along to the Nurse on the receiving unit. He verbalized understanding.  1244 Report was given to Jorge Mandril, RN at the [facility].  She verbalized understanding and was told to call me for questions.  Pt left the floor via stretcher with [facility] transportation in stable condition.  Throughout the process the patients family/POA was updated.

## 2012-08-15 ENCOUNTER — Encounter (HOSPITAL_COMMUNITY): Payer: Self-pay | Admitting: Internal Medicine

## 2012-08-15 NOTE — Progress Notes (Signed)
UR Chart Review Completed  

## 2012-08-16 ENCOUNTER — Ambulatory Visit (INDEPENDENT_AMBULATORY_CARE_PROVIDER_SITE_OTHER): Payer: Medicare Other | Admitting: Internal Medicine

## 2012-10-31 ENCOUNTER — Inpatient Hospital Stay (HOSPITAL_COMMUNITY)
Admission: AD | Admit: 2012-10-31 | Discharge: 2012-11-03 | DRG: 812 | Disposition: A | Discharge status: Skilled Nursing Facility | Payer: Medicare Other | Source: Ambulatory Visit | Attending: Pulmonary Disease | Admitting: Pulmonary Disease

## 2012-10-31 ENCOUNTER — Encounter (HOSPITAL_COMMUNITY): Payer: Self-pay

## 2012-10-31 ENCOUNTER — Inpatient Hospital Stay (HOSPITAL_COMMUNITY): Payer: Medicare Other

## 2012-10-31 DIAGNOSIS — F411 Generalized anxiety disorder: Secondary | ICD-10-CM | POA: Diagnosis present

## 2012-10-31 DIAGNOSIS — I340 Nonrheumatic mitral (valve) insufficiency: Secondary | ICD-10-CM | POA: Diagnosis present

## 2012-10-31 DIAGNOSIS — Z79899 Other long term (current) drug therapy: Secondary | ICD-10-CM

## 2012-10-31 DIAGNOSIS — R109 Unspecified abdominal pain: Secondary | ICD-10-CM | POA: Diagnosis present

## 2012-10-31 DIAGNOSIS — R51 Headache: Secondary | ICD-10-CM | POA: Diagnosis present

## 2012-10-31 DIAGNOSIS — I350 Nonrheumatic aortic (valve) stenosis: Secondary | ICD-10-CM | POA: Diagnosis present

## 2012-10-31 DIAGNOSIS — I080 Rheumatic disorders of both mitral and aortic valves: Secondary | ICD-10-CM | POA: Diagnosis present

## 2012-10-31 DIAGNOSIS — R1012 Left upper quadrant pain: Secondary | ICD-10-CM | POA: Diagnosis present

## 2012-10-31 DIAGNOSIS — E876 Hypokalemia: Secondary | ICD-10-CM | POA: Diagnosis present

## 2012-10-31 DIAGNOSIS — E7800 Pure hypercholesterolemia, unspecified: Secondary | ICD-10-CM | POA: Diagnosis present

## 2012-10-31 DIAGNOSIS — F419 Anxiety disorder, unspecified: Secondary | ICD-10-CM | POA: Diagnosis present

## 2012-10-31 DIAGNOSIS — F329 Major depressive disorder, single episode, unspecified: Secondary | ICD-10-CM | POA: Diagnosis present

## 2012-10-31 DIAGNOSIS — R627 Adult failure to thrive: Secondary | ICD-10-CM | POA: Diagnosis present

## 2012-10-31 DIAGNOSIS — K449 Diaphragmatic hernia without obstruction or gangrene: Secondary | ICD-10-CM | POA: Diagnosis present

## 2012-10-31 DIAGNOSIS — R5381 Other malaise: Secondary | ICD-10-CM | POA: Diagnosis present

## 2012-10-31 DIAGNOSIS — F3289 Other specified depressive episodes: Secondary | ICD-10-CM | POA: Diagnosis present

## 2012-10-31 DIAGNOSIS — K219 Gastro-esophageal reflux disease without esophagitis: Secondary | ICD-10-CM | POA: Diagnosis present

## 2012-10-31 DIAGNOSIS — I2510 Atherosclerotic heart disease of native coronary artery without angina pectoris: Secondary | ICD-10-CM | POA: Diagnosis present

## 2012-10-31 DIAGNOSIS — I10 Essential (primary) hypertension: Secondary | ICD-10-CM | POA: Diagnosis present

## 2012-10-31 DIAGNOSIS — D500 Iron deficiency anemia secondary to blood loss (chronic): Principal | ICD-10-CM | POA: Diagnosis present

## 2012-10-31 DIAGNOSIS — Z9889 Other specified postprocedural states: Secondary | ICD-10-CM

## 2012-10-31 LAB — COMPREHENSIVE METABOLIC PANEL
Albumin: 3.4 g/dL — ABNORMAL LOW (ref 3.5–5.2)
Alkaline Phosphatase: 108 U/L (ref 39–117)
BUN: 42 mg/dL — ABNORMAL HIGH (ref 6–23)
Calcium: 9.2 mg/dL (ref 8.4–10.5)
Potassium: 3 mEq/L — ABNORMAL LOW (ref 3.5–5.1)
Sodium: 138 mEq/L (ref 135–145)
Total Protein: 6.9 g/dL (ref 6.0–8.3)

## 2012-10-31 LAB — CBC WITH DIFFERENTIAL/PLATELET
Basophils Absolute: 0 10*3/uL (ref 0.0–0.1)
Eosinophils Relative: 2 % (ref 0–5)
HCT: 25.3 % — ABNORMAL LOW (ref 36.0–46.0)
Hemoglobin: 7.6 g/dL — ABNORMAL LOW (ref 12.0–15.0)
Lymphocytes Relative: 26 % (ref 12–46)
Lymphs Abs: 1.5 10*3/uL (ref 0.7–4.0)
MCV: 84.3 fL (ref 78.0–100.0)
Monocytes Absolute: 0.5 10*3/uL (ref 0.1–1.0)
Monocytes Relative: 9 % (ref 3–12)
RDW: 15.9 % — ABNORMAL HIGH (ref 11.5–15.5)
WBC: 5.7 10*3/uL (ref 4.0–10.5)

## 2012-10-31 LAB — RETICULOCYTES: Retic Ct Pct: 2.3 % (ref 0.4–3.1)

## 2012-10-31 MED ORDER — TORSEMIDE 20 MG PO TABS
50.0000 mg | ORAL_TABLET | Freq: Every day | ORAL | Status: DC
  Administered 2012-11-01 – 2012-11-03 (×3): 50 mg via ORAL
  Filled 2012-10-31 (×3): qty 3

## 2012-10-31 MED ORDER — POLYETHYLENE GLYCOL 3350 17 G PO PACK
17.0000 g | PACK | Freq: Every day | ORAL | Status: DC
  Administered 2012-11-01: 17 g via ORAL
  Filled 2012-10-31 (×2): qty 1

## 2012-10-31 MED ORDER — ALUM & MAG HYDROXIDE-SIMETH 200-200-20 MG/5ML PO SUSP
30.0000 mL | Freq: Four times a day (QID) | ORAL | Status: DC | PRN
  Administered 2012-11-02: 30 mL via ORAL
  Filled 2012-10-31: qty 30

## 2012-10-31 MED ORDER — MIRTAZAPINE 30 MG PO TABS
15.0000 mg | ORAL_TABLET | Freq: Every day | ORAL | Status: DC
  Administered 2012-10-31 – 2012-11-02 (×3): 15 mg via ORAL
  Filled 2012-10-31 (×3): qty 1

## 2012-10-31 MED ORDER — ESCITALOPRAM OXALATE 10 MG PO TABS
20.0000 mg | ORAL_TABLET | Freq: Every day | ORAL | Status: DC
  Administered 2012-11-01 – 2012-11-03 (×3): 20 mg via ORAL
  Filled 2012-10-31 (×3): qty 2

## 2012-10-31 MED ORDER — TRAZODONE HCL 50 MG PO TABS: 25.0000 mg | ORAL_TABLET | Freq: Every evening | ORAL | Status: DC | PRN

## 2012-10-31 MED ORDER — ONDANSETRON HCL 4 MG PO TABS: 4.0000 mg | ORAL_TABLET | Freq: Four times a day (QID) | ORAL | Status: DC | PRN

## 2012-10-31 MED ORDER — SODIUM CHLORIDE 0.9 % IV SOLN
INTRAVENOUS | Status: DC
  Administered 2012-10-31 – 2012-11-02 (×2): via INTRAVENOUS

## 2012-10-31 MED ORDER — ONDANSETRON HCL 4 MG/2ML IJ SOLN: 4.0000 mg | Freq: Four times a day (QID) | INTRAMUSCULAR | Status: DC | PRN

## 2012-10-31 MED ORDER — CARVEDILOL 3.125 MG PO TABS
3.1250 mg | ORAL_TABLET | Freq: Two times a day (BID) | ORAL | Status: DC
  Administered 2012-11-01 – 2012-11-03 (×5): 3.125 mg via ORAL
  Filled 2012-10-31 (×5): qty 1

## 2012-10-31 MED ORDER — PANTOPRAZOLE SODIUM 40 MG PO TBEC
40.0000 mg | DELAYED_RELEASE_TABLET | Freq: Every day | ORAL | Status: DC
  Administered 2012-11-01 – 2012-11-03 (×4): 40 mg via ORAL
  Filled 2012-10-31 (×3): qty 1

## 2012-10-31 MED ORDER — ACETAMINOPHEN 325 MG PO TABS
650.0000 mg | ORAL_TABLET | Freq: Four times a day (QID) | ORAL | Status: DC | PRN
  Administered 2012-11-03: 650 mg via ORAL
  Filled 2012-10-31 (×2): qty 2

## 2012-10-31 MED ORDER — ACETAMINOPHEN 650 MG RE SUPP: 650.0000 mg | Freq: Four times a day (QID) | RECTAL | Status: DC | PRN

## 2012-10-31 MED ORDER — ENOXAPARIN SODIUM 30 MG/0.3ML ~~LOC~~ SOLN
30.0000 mg | SUBCUTANEOUS | Status: DC
  Administered 2012-10-31 – 2012-11-02 (×3): 30 mg via SUBCUTANEOUS
  Filled 2012-10-31 (×3): qty 0.3

## 2012-10-31 MED ORDER — LIDOCAINE 5 % EX PTCH
1.0000 | MEDICATED_PATCH | CUTANEOUS | Status: DC
  Administered 2012-11-01 – 2012-11-02 (×2): 1 via TRANSDERMAL
  Filled 2012-10-31 (×5): qty 1

## 2012-10-31 MED ORDER — GABAPENTIN 100 MG PO CAPS
100.0000 mg | ORAL_CAPSULE | Freq: Every day | ORAL | Status: DC
Start: 2012-10-31 — End: 2012-11-03
  Administered 2012-10-31 – 2012-11-02 (×3): 100 mg via ORAL
  Filled 2012-10-31 (×3): qty 1

## 2012-10-31 MED ORDER — POTASSIUM CHLORIDE CRYS ER 20 MEQ PO TBCR
20.0000 meq | EXTENDED_RELEASE_TABLET | Freq: Two times a day (BID) | ORAL | Status: DC
  Administered 2012-10-31 – 2012-11-03 (×7): 20 meq via ORAL
  Filled 2012-10-31 (×7): qty 1

## 2012-10-31 MED ORDER — TRAVOPROST (BAK FREE) 0.004 % OP SOLN
1.0000 [drp] | Freq: Every day | OPHTHALMIC | Status: DC
  Administered 2012-11-01 – 2012-11-02 (×2): 1 [drp] via OPHTHALMIC
  Filled 2012-10-31: qty 2.5

## 2012-10-31 MED ORDER — HYDROCODONE-ACETAMINOPHEN 5-325 MG PO TABS
1.0000 | ORAL_TABLET | ORAL | Status: DC | PRN
  Administered 2012-10-31 (×2): 1 via ORAL
  Administered 2012-11-01 (×4): 2 via ORAL
  Administered 2012-11-01: 1 via ORAL
  Administered 2012-11-02 – 2012-11-03 (×8): 2 via ORAL
  Filled 2012-10-31 (×3): qty 2
  Filled 2012-10-31: qty 1
  Filled 2012-10-31 (×8): qty 2
  Filled 2012-10-31 (×2): qty 1
  Filled 2012-10-31: qty 2

## 2012-11-01 LAB — IRON AND TIBC
Iron: 17 ug/dL — ABNORMAL LOW (ref 42–135)
Saturation Ratios: 4 % — ABNORMAL LOW (ref 20–55)
TIBC: 406 ug/dL (ref 250–470)
UIBC: 389 ug/dL (ref 125–400)

## 2012-11-01 LAB — BASIC METABOLIC PANEL
BUN: 32 mg/dL — ABNORMAL HIGH (ref 6–23)
CO2: 29 mEq/L (ref 19–32)
Calcium: 8.7 mg/dL (ref 8.4–10.5)
Chloride: 101 mEq/L (ref 96–112)
Creatinine, Ser: 1.29 mg/dL — ABNORMAL HIGH (ref 0.50–1.10)
GFR calc Af Amer: 41 mL/min — ABNORMAL LOW (ref >90)
GFR calc non Af Amer: 35 mL/min — ABNORMAL LOW (ref >90)
Glucose, Bld: 97 mg/dL (ref 70–99)
Potassium: 3.6 mEq/L (ref 3.5–5.1)
Sodium: 138 mEq/L (ref 135–145)

## 2012-11-01 LAB — CBC WITH DIFFERENTIAL/PLATELET
HCT: 23.9 % — ABNORMAL LOW (ref 36.0–46.0)
Hemoglobin: 7.1 g/dL — ABNORMAL LOW (ref 12.0–15.0)
Lymphocytes Relative: 44 % (ref 12–46)
Monocytes Absolute: 0.5 10*3/uL (ref 0.1–1.0)
Monocytes Relative: 12 % (ref 3–12)
Neutro Abs: 1.8 10*3/uL (ref 1.7–7.7)
Neutrophils Relative %: 40 % — ABNORMAL LOW (ref 43–77)
RBC: 2.8 MIL/uL — ABNORMAL LOW (ref 3.87–5.11)
WBC: 4.6 10*3/uL (ref 4.0–10.5)

## 2012-11-01 LAB — URINALYSIS, DIPSTICK ONLY
Glucose, UA: NEGATIVE mg/dL
Ketones, ur: NEGATIVE mg/dL
Protein, ur: NEGATIVE mg/dL
pH: 6 (ref 5.0–8.0)

## 2012-11-01 LAB — FERRITIN: Ferritin: 24 ng/mL (ref 10–291)

## 2012-11-01 LAB — HEMOGLOBIN AND HEMATOCRIT, BLOOD: Hemoglobin: 10.4 g/dL — ABNORMAL LOW (ref 12.0–15.0)

## 2012-11-01 LAB — ABO/RH: ABO/RH(D): A POS

## 2012-11-01 MED ORDER — ENSURE COMPLETE PO LIQD
237.0000 mL | Freq: Two times a day (BID) | ORAL | Status: DC
  Administered 2012-11-01 – 2012-11-03 (×2): 237 mL via ORAL

## 2012-11-01 NOTE — Clinical Social Work Psychosocial (Signed)
Clinical Social Work Department BRIEF PSYCHOSOCIAL ASSESSMENT 11/01/2012  Patient:  [name], [name]     Account Number:  [id number]     Admit date:  10/31/2012  Clinical Social Worker:  Nancie Neas  Date/Time:  11/01/2012 02:05 PM  Referred by:  Physician  Date Referred:  11/01/2012 Referred for  SNF Placement   Other Referral:   Interview type:  Patient Other interview type:   and niece- Susie    PSYCHOSOCIAL DATA Living Status:  ALONE Admitted from facility:   Level of care:   Primary support name:  Susie Primary support relationship to patient:  FAMILY Degree of support available:   supportive    CURRENT CONCERNS Current Concerns  Post-Acute Placement   Other Concerns:    SOCIAL WORK ASSESSMENT / PLAN CSW met with pt and pt's niece/POA, Susie at bedside. Pt alert and oriented and reports she has been living at home alone since Friday when she was d/c from [facility]. Pt admits she was not doing well on her own even during this short period of time. Susie was having to come assist pt with a lot of things as pt was unable to fix anything to eat and was having episodes of incontinence. Pt had been at [facility] for about 2 months. She has been to several facilities in the past. When CSW entered room, pt was crying as she and her niece discussed going back to SNF. Pt clearly enjoys her independence but realizes she needs assistance as she is very deconditioned. Pt and niece discussed several facilities, but pt decided that [facility] would be her preference again. Susie works at facility and states that they have bed available for pt. CSW will fax out FL2 and follow up with pt. Pt also mentioned possibly paying for care at home. She states she is agreeable to SNF for 30 days and they will discuss plan after that.   Assessment/plan status:  Psychosocial Support/Ongoing Assessment of Needs Other assessment/ plan:   Information/referral to community  resources:   SNF list    PATIENT'S/FAMILY'S RESPONSE TO PLAN OF CARE: Pt emotional as she thought about leaving her home again for rehab. However, she appears resigned to the fact that she cannot manage on her own right now.       Derenda Fennel, [place] [phone]

## 2012-11-01 NOTE — Progress Notes (Signed)
Utilization Review Complete  

## 2012-11-01 NOTE — Consult Note (Signed)
Referring Provider: No ref. provider found Primary Care Physician:  Fredirick Maudlin, MD Primary Gastroenterologist:  Dr. Karilyn Cota  Reason for Consultation:  Iron deficiency anemia and persistent left upper quadrant abdominal pain.  HPI: Patient is a 79 year old Caucasian female was admitted to Dr. Juanetta Gosling service yesterday for progressive weakness poor appetite and left upper quadrant abdominal pain. Patient is well known to me from recent evaluation in mid March this year when she was admitted to) hospital with nausea vomiting and upper GI bleed. In February this year she was admitted to [facility] with nausea vomiting and hematemesis. She was found to have large hiatal hernia with gastric upper traction. She responded to NG aspiration. She was felt to be high-risk for surgical intervention. When I saw her back in March 2014, she was noted to be anemic with hemoglobin of 9.5 g. She underwent esophagogastroduodenoscopy revealing large hiatal hernia with again no axial rotation and relative obstruction at the level of hiatus. Most of her stomach is felt to be intrathoracic and she had mild changes of reflux esophagitis limited to GE junction. She did not have pyloric channel stenosis. Was concluded that she would not respond to medical therapy. Patient was transferred to [facility] per her request(she has lived and worked in [place] all her life). Patient had laparoscopic reduction of hernia with gastropexy and gastrostomy. Patient states she did well following the surgery. She did not have nausea vomiting or abdominal pain. However she has not been happy at the rest home that she's been cared for. She did not like the food at the facility and has been eating poorly. About 3 weeks ago she she had gastrostomy tube removed and ever since she has had left upper quadrant abdominal pain. She describes this pain to be sharp and constant and she gets partial relief with pain medications. Over the  last few weeks she's noted progressive weakness with poor oral intake. She was evaluated by Dr. Juanetta Gosling and hospitalized. She was noted to have hemoglobin of 7.6 g and this morning was 7.1 g and she is receiving first of the 2 units that have been ordered. She denies melena or rectal bleeding hematuria or vaginal bleeding. She says her bowels move daily as long as she takes MiraLax. She feels she may have lost a few pounds in the last 3 weeks. She also complains of sweating and feeling hot every evening but she's not sure if she's been running fever. She denies cough chest pain or shortness of breath. She does not take OTC NSAIDs. Patient has never undergone screening for colorectal carcinoma. This was discussed with her a few years ago but postponed because of cardiomyopathy. Patient is not interested in colonoscopy at the present time.  Past Medical History  Diagnosis Date  . Coronary artery disease   . Hypercholesterolemia   . Hypertension   . Mitral insufficiency   . Aortic stenosis   . Hiatal hernia   . GERD (gastroesophageal reflux disease)   . History of stomach ulcers   . Chronic headaches   . Anxiety and depression     Past Surgical History  Procedure Laterality Date  . Abdominal hysterectomy      in her 30's  . Hip fracture surgery    . Shoulder surgery Right   . Esophagogastroduodenoscopy N/A 08/12/2012    Procedure: ESOPHAGOGASTRODUODENOSCOPY (EGD);  Surgeon: Malissa Hippo, MD;  Location: [facility];  Service: Endoscopy;  Laterality: N/A;    Prior to Admission medications  Medication Sig Start Date End Date Taking? Authorizing Provider  aspirin EC 81 MG tablet Take 81 mg by mouth daily.   Yes Historical Provider, MD  carvedilol (COREG) 3.125 MG tablet Take 3.125 mg by mouth 2 (two) times daily with a meal.   Yes Historical Provider, MD  escitalopram (LEXAPRO) 20 MG tablet Take 20 mg by mouth daily.   Yes Historical Provider, MD  gabapentin (NEURONTIN) 100 MG capsule  Take 100 mg by mouth at bedtime.   Yes Historical Provider, MD  HYDROcodone-acetaminophen (NORCO/VICODIN) 5-325 MG per tablet Take 2 tablets by mouth 2 (two) times daily. Take 2 tablets at 7 am and 11 pm   Yes Historical Provider, MD  HYDROcodone-acetaminophen (NORCO/VICODIN) 5-325 MG per tablet Take 1 tablet by mouth every 4 (four) hours as needed for pain.   Yes Historical Provider, MD  lidocaine (LIDODERM) 5 % Place 1 patch onto the skin daily. Remove & Discard patch within 12 hours or as directed by MD   Yes Historical Provider, MD  mirtazapine (REMERON) 15 MG tablet Take 15 mg by mouth at bedtime.   Yes Historical Provider, MD  pantoprazole (PROTONIX) 40 MG tablet Take 40 mg by mouth daily.   Yes Historical Provider, MD  polyethylene glycol (MIRALAX / GLYCOLAX) packet Take 17 g by mouth daily.   Yes Historical Provider, MD  potassium chloride SA (K-DUR,KLOR-CON) 20 MEQ tablet Take 20 mEq by mouth 2 (two) times daily.   Yes Historical Provider, MD  torsemide (DEMADEX) 100 MG tablet Take 50 mg by mouth daily.   Yes Historical Provider, MD  Travoprost, BAK Free, (TRAVATAN) 0.004 % SOLN ophthalmic solution Place 1 drop into both eyes at bedtime.   Yes Historical Provider, MD    Current Facility-Administered Medications  Medication Dose Route Frequency Provider Last Rate Last Dose  . 0.9 %  sodium chloride infusion   Intravenous Continuous Fredirick Maudlin, MD 50 mL/hr at 10/31/12 1455    . acetaminophen (TYLENOL) tablet 650 mg  650 mg Oral Q6H PRN Fredirick Maudlin, MD       Or  . acetaminophen (TYLENOL) suppository 650 mg  650 mg Rectal Q6H PRN Fredirick Maudlin, MD      . alum & mag hydroxide-simeth (MAALOX/MYLANTA) 200-200-20 MG/5ML suspension 30 mL  30 mL Oral Q6H PRN Fredirick Maudlin, MD      . carvedilol (COREG) tablet 3.125 mg  3.125 mg Oral BID WC Fredirick Maudlin, MD   3.125 mg at 11/01/12 1017  . enoxaparin (LOVENOX) injection 30 mg  30 mg Subcutaneous Q24H Fredirick Maudlin, MD   30 mg  at 10/31/12 2218  . escitalopram (LEXAPRO) tablet 20 mg  20 mg Oral Daily Fredirick Maudlin, MD   20 mg at 11/01/12 1011  . feeding supplement (ENSURE COMPLETE) liquid 237 mL  237 mL Oral BID BM Francene Boyers, RD      . gabapentin (NEURONTIN) capsule 100 mg  100 mg Oral QHS Fredirick Maudlin, MD   100 mg at 10/31/12 2217  . HYDROcodone-acetaminophen (NORCO/VICODIN) 5-325 MG per tablet 1-2 tablet  1-2 tablet Oral Q4H PRN Fredirick Maudlin, MD   2 tablet at 11/01/12 1312  . lidocaine (LIDODERM) 5 % 1 patch  1 patch Transdermal Q24H Fredirick Maudlin, MD      . mirtazapine (REMERON) tablet 15 mg  15 mg Oral QHS Fredirick Maudlin, MD   15 mg at 10/31/12 2217  . ondansetron (ZOFRAN) tablet  4 mg  4 mg Oral Q6H PRN Fredirick Maudlin, MD       Or  . ondansetron [facility]) injection 4 mg  4 mg Intravenous Q6H PRN Fredirick Maudlin, MD      . pantoprazole (PROTONIX) EC tablet 40 mg  40 mg Oral Daily Fredirick Maudlin, MD   40 mg at 11/01/12 1012  . polyethylene glycol (MIRALAX / GLYCOLAX) packet 17 g  17 g Oral Daily Fredirick Maudlin, MD   17 g at 11/01/12 1020  . potassium chloride SA (K-DUR,KLOR-CON) CR tablet 20 mEq  20 mEq Oral BID Fredirick Maudlin, MD   20 mEq at 11/01/12 1012  . torsemide (DEMADEX) tablet 50 mg  50 mg Oral Daily Fredirick Maudlin, MD   50 mg at 11/01/12 1011  . Travoprost (BAK Free) (TRAVATAN) 0.004 % ophthalmic solution SOLN 1 drop  1 drop Both Eyes QHS Fredirick Maudlin, MD      . traZODone (DESYREL) tablet 25 mg  25 mg Oral QHS PRN Fredirick Maudlin, MD        Allergies as of 10/31/2012 - Review Complete 10/31/2012  Allergen Reaction Noted  . Allegra (fexofenadine) Nausea And Vomiting 07/18/2012  . Codeine Nausea And Vomiting 07/18/2012  . Naproxen Nausea Only 07/18/2012    History reviewed. No pertinent family history.  History   Social History  . Marital Status: Widowed    Spouse Name: N/A    Number of Children: N/A  . Years of Education: N/A   Occupational History  .  Not on file.   Social History Main Topics  . Smoking status: Never Smoker   . Smokeless tobacco: Not on file  . Alcohol Use: No  . Drug Use: No  . Sexually Active: Not on file   Other Topics Concern  . Not on file   Social History Narrative   Patient is in [place] by herself.   Her niece visits her every day to help her with groceries and other stuff.   Overall she is independent.    Review of Systems: See HPI, otherwise normal ROS  Physical Exam: Temp:  [97.5 F (36.4 C)-98.3 F (36.8 C)] 98.3 F (36.8 C) (06/05 1544) Pulse Rate:  [75-82] 75 (06/05 1544) Resp:  [17-18] 17 (06/05 1445) BP: (102-114)/(63-72) 107/64 mmHg (06/05 1544) SpO2:  [93 %-97 %] 97 % (06/05 1544) Weight:  [134 lb 0.6 oz (60.8 kg)] 134 lb 0.6 oz (60.8 kg) (06/05 1478)   pleasant well-developed well-nourished Caucasian female who is in no acute distress. She appears very pale. Conjunctivae was also pale and sclera are nonicteric. Oropharyngeal mucosa is normal. She is edentulous. She has dentures but they're at home. No neck masses or thyromegaly noted. Cardiac exam with regular rhythm normal S1 and S2. Loud systolic ejection murmur best heard at pulmonary area. Lungs are clear to auscultation. Abdomen is symmetrical with well-healed laparoscopy scars as well as scars from previous gastrostomy. Bowel sounds are normal. Abdomen is soft on palpation with mild to moderate tenderness below the left costal margin with some guarding. No organomegaly or masses noted. No peripheral edema clubbing or koilynychia noted.  Intake/Output from previous day: 06/04 0701 - 06/05 0700 In: 240 [P.O.:240] Out: -  Intake/Output this shift: Total I/O In: 400 [P.O.:400] Out: -   Lab Results:  Recent Labs  10/31/12 1352 11/01/12 0510  WBC 5.7 4.6  HGB 7.6* 7.1*  HCT 25.3* 23.9*  PLT 378 344   BMET  Recent Labs  10/31/12 1352 11/01/12 0510  NA 138 138  K 3.0* 3.6  CL 97 101  CO2 27 29  GLUCOSE  160* 97  BUN 42* 32*  CREATININE 1.55* 1.29*  CALCIUM 9.2 8.7   LFT  Recent Labs  10/31/12 1352  PROT 6.9  ALBUMIN 3.4*  AST 18  ALT 9  ALKPHOS 108  BILITOT 0.1*   PT/INR No results found for this basename: LABPROT, INR,  in the last 72 hours Hepatitis Panel No results found for this basename: HEPBSAG, HCVAB, HEPAIGM, HEPBIGM,  in the last 72 hours  Studies/Results: Dg Chest 1 View  10/31/2012   *RADIOLOGY REPORT*  Clinical Data: Evaluate for CHF.  CHEST - 1 VIEW  Comparison: 08/11/2012  Findings: Very large hiatal hernia is again noted.  The heart size appears within normal limits.  There is no pleural effusion visible.  No airspace consolidation noted.  Postsurgical changes involving the right humerus identified.  IMPRESSION:  1.  Large hiatal hernia. 2.  No acute cardiopulmonary abnormalities. No specific features to suggest CHF.   Original Report Authenticated By: Signa Kell, M.D.   US Abdomen Complete  10/31/2012   *RADIOLOGY REPORT*  Clinical Data:  Abdominal pain.  Left flank pain.  History of hernia surgery.  Hyperlipidemia.  COMPLETE ABDOMINAL ULTRASOUND  Comparison:  CT 07/18/2012.  Findings:  Gallbladder:  No gallstones, gallbladder wall thickening, or pericholecystic fluid.  Common bile duct:  9 mm which is within normal limits for age.  No common duct stone identified.  Liver:  No focal lesion identified.  Within normal limits in parenchymal echogenicity.  IVC:  Appears normal.  Pancreas:  Tail obscured by overlying bowel gas.  Spleen:  6.2 cm.  Suboptimally visualized due to overlying bowel gas.  No gross abnormality.  Right Kidney:  Renal cortical thinning compatible with medical renal disease.  9 mm echogenic focus in the inferior renal pole. This may represent milk of calcium layering within the cyst or echogenic/proteinaceous debris.  Left Kidney:  11 cm.  Cortical thinning and increased echogenicity compatible with medical renal disease.  Left upper pole renal cyst is  present with increased through transmission.  Abdominal aorta:  No aneurysm identified.  IMPRESSION:  1.  No acute abnormality.  Negative for cholelithiasis. 2.  Echogenic lesion in the right inferior renal pole probably represents hemorrhagic or proteinaceous cyst.  Reasonable follow-up would be a repeat renal ultrasound in 6 months.  This was probably a proteinaceous cyst on the prior CT. Simple left renal cyst. 3.  Renal cortical thinning compatible with medical renal disease.   Original Report Authenticated By: Andreas Newport, M.D.    Assessment; Patient's GI problems can be summarized as below. #1. Left upper quadrant abdominal pain since gastrostomy tube was removed 2 weeks ago. She is tender in left upper quadrant with some guarding. Pain is described to be intense relief partially with pain medications. Ultrasound failed to show any abnormality to account for pain. She needs to be further evaluated since she appears to be quite symptomatic. Need to rule out fluid collection or hematoma. #2. Iron deficiency anemia. This is probably acute on chronic problem. Her hemoglobin in March 2014 was 9.5. This morning was down to 7.1. She reports losing a lot of blood from accidental cuts to her left forearm few weeks ago. No history of melena or rectal bleeding. Patient not interested in colonoscopy. #3. History of large hiatal hernia with organoaxial rotation status post laparoscopic intervention on  08/16/2012 at [facility]. So far so good.  Recommendations; Abdominopelvic CT in a.m. with contrast if renal function will permit. Will check metabolic 7 in a.m.. H&H in a.m.   LOS: 1 day   Mahrosh Donnell U  11/01/2012, 3:49 PM

## 2012-11-01 NOTE — Clinical Social Work Placement (Signed)
Clinical Social Work Department CLINICAL SOCIAL WORK PLACEMENT NOTE 11/01/2012  Patient:  KOREA, SEVERS  Account Number:  [id number] Admit date:  10/31/2012  Clinical Social Worker:  Derenda Fennel, LCSW  Date/time:  11/01/2012 02:00 PM  Clinical Social Work is seeking post-discharge placement for this patient at the following level of care:   SKILLED NURSING   (*CSW will update this form in Epic as items are completed)   11/01/2012  Patient/family provided with [facility] Department of Clinical Social Work's list of facilities offering this level of care within the geographic area requested by the patient (or if unable, by the patient's family).  11/01/2012  Patient/family informed of their freedom to choose among providers that offer the needed level of care, that participate in Medicare, Medicaid or managed care program needed by the patient, have an available bed and are willing to accept the patient.  11/01/2012  Patient/family informed of [facility]' ownership interest in [facility], as well as of the fact that they are under no obligation to receive care at this facility.  PASARR submitted to EDS on  PASARR number received from EDS on   FL2 transmitted to all facilities in geographic area requested by pt/family on  11/01/2012 FL2 transmitted to all facilities within larger geographic area on   Patient informed that his/her managed care company has contracts with or will negotiate with  certain facilities, including the following:     Patient/family informed of bed offers received:   Patient chooses bed at  Physician recommends and patient chooses bed at    Patient to be transferred to  on   Patient to be transferred to facility by   The following physician request were entered in Epic:   Additional Comments: Pt has existing pasarr.   Derenda Fennel, [place] [phone]

## 2012-11-01 NOTE — Care Management Note (Unsigned)
    Page 1 of 1   11/01/2012     12:59:44 PM   CARE MANAGEMENT NOTE 11/01/2012  Patient:  [name], [name]   Account Number:  [id number]  Date Initiated:  11/01/2012  Documentation initiated by:  Rosemary Holms  Subjective/Objective Assessment:   pt admitted from home where she lives with her niece. CSW assisting with possible placement at SNF. CM will follow.     Action/Plan:   Anticipated DC Date:  11/03/2012   Anticipated DC Plan:  SKILLED NURSING FACILITY  In-house referral  Clinical Social Worker      DC Planning Services  CM consult      Choice offered to / List presented to:             Status of service:  In process, will continue to follow Medicare Important Message given?   (If response is "NO", the following Medicare IM given date fields will be blank) Date Medicare IM given:   Date Additional Medicare IM given:    Discharge Disposition:    Per UR Regulation:    If discussed at Long Length of Stay Meetings, dates discussed:    Comments:  11/01/12 Rosemary Holms RN BSN CM

## 2012-11-01 NOTE — Evaluation (Signed)
Physical Therapy Evaluation Patient Details Name: [name] MRN: [id number] DOB: April 18, 1920 Today's Date: 11/01/2012 Time: 4098-1191 PT Time Calculation (min): 20 min  PT Assessment / Plan / Recommendation Clinical Impression  Pt was seen for evaluation.  She is very pleasant and cooperative, extremely deconditioned and mobility is significantly limited by left flank pain.  She needed moderated assist to transfer supine to sit and minimal assist to transfer bed to [facility].  She lives alone and normally ambulates with a walker, however she was c/o too much pain to be able to try to walk.  I am recommending SNF at d/c and pt is agreeable.    PT Assessment  Patient needs continued PT services    Follow Up Recommendations  SNF    Does the patient have the potential to tolerate intense rehabilitation      Barriers to Discharge Decreased caregiver support      Equipment Recommendations  None recommended by PT    Recommendations for Other Services OT consult   Frequency Min 3X/week    Precautions / Restrictions Precautions Precautions: Fall Restrictions Weight Bearing Restrictions: No   Pertinent Vitals/Pain       Mobility  Bed Mobility Bed Mobility: Supine to Sit;Sit to Supine Supine to Sit: 3: Mod assist;HOB elevated Sit to Supine: 4: Min assist;HOB elevated Transfers Transfers: Editor, commissioning Transfers: 4: Min assist Details for Transfer Assistance: transfer bed to [facility] was very slow and labored Ambulation/Gait Ambulation/Gait Assistance: Not tested (comment) (pt states that she is having too much pain to walk)    Exercises General Exercises - Lower Extremity Ankle Circles/Pumps: AROM;10 reps;Supine;Both Short Arc Quad: AROM;Both;10 reps;Supine Heel Slides: AAROM;Both;10 reps;Supine Hip ABduction/ADduction: AAROM;Both;10 reps;Supine   PT Diagnosis: Difficulty walking;Generalized weakness;Acute pain  PT Problem List: Decreased strength;Decreased  activity tolerance;Decreased mobility;Pain PT Treatment Interventions: Gait training;Functional mobility training;Therapeutic activities;Therapeutic exercise;Patient/family education   PT Goals Acute Rehab PT Goals PT Goal Formulation: With patient Time For Goal Achievement: 11/15/12 Potential to Achieve Goals: Good Pt will go Supine/Side to Sit: with min assist;with HOB not 0 degrees (comment degree) PT Goal: Supine/Side to Sit - Progress: Goal set today Pt will go Sit to Supine/Side: with supervision;with HOB not 0 degrees (comment degree) PT Goal: Sit to Supine/Side - Progress: Goal set today Pt will go Sit to Stand: with supervision PT Goal: Sit to Stand - Progress: Goal set today Pt will go Stand to Sit: with supervision;with upper extremity assist PT Goal: Stand to Sit - Progress: Goal set today Pt will Ambulate: 16 - 50 feet;with supervision;with rolling walker PT Goal: Ambulate - Progress: Goal set today  Visit Information  Last PT Received On: 11/01/12    Subjective Data  Subjective: my left side has hurt since my feeding tube was removed Patient Stated Goal: plans to go to rest home for 30 days at discharge   Prior Functioning  Home Living Lives With: Alone Available Help at Discharge: Family;Available PRN/intermittently Type of Home: Apartment Home Access: Level entry Home Layout: One level Bathroom Shower/Tub: Tub/shower unit Home Adaptive Equipment: Scientist, forensic - four wheeled;Bedside commode/3-in-1;Shower chair without back Additional Comments: ambulates with walker Prior Function Level of Independence: Independent with assistive device(s) Able to Take Stairs?: No Driving: No Vocation: Retired Musician: No difficulties    Copywriter, advertising Arousal/Alertness: Awake/alert Behavior During Therapy: WFL for tasks assessed/performed Overall Cognitive Status: Within Functional Limits for tasks assessed    Extremity/Trunk  Assessment Right Lower Extremity Assessment RLE  ROM/Strength/Tone: Deficits RLE ROM/Strength/Tone Deficits: strength 3/5 RLE Sensation: WFL - Light Touch RLE Coordination: WFL - gross motor Left Lower Extremity Assessment LLE ROM/Strength/Tone: Deficits LLE ROM/Strength/Tone Deficits: strength 3/5 LLE Sensation: WFL - Light Touch LLE Coordination: WFL - gross motor Trunk Assessment Trunk Assessment: Kyphotic   Balance Balance Balance Assessed: Yes Dynamic Sitting Balance Dynamic Sitting - Balance Support: No upper extremity supported;Feet supported Dynamic Sitting - Level of Assistance: 6: Modified independent (Device/Increase time)  End of Session PT - End of Session Equipment Utilized During Treatment: Gait belt Activity Tolerance: Patient limited by pain;Patient limited by fatigue Patient left: in bed;with call bell/phone within reach;with bed alarm set Nurse Communication: Mobility status  GP     Konrad Penta 11/01/2012, 1:23 PM

## 2012-11-01 NOTE — Clinical Social Work Note (Signed)
CSW received consult for possible placement. Awaiting PT eval for appropriate level of care.   Derenda Fennel, [place] [phone]

## 2012-11-01 NOTE — Progress Notes (Signed)
Subjective: She is still having a lot of flank pain. She still very weak. Her hemoglobin level is low. Her potassium is better  Objective: Vital signs in last 24 hours: Temp:  [97.5 F (36.4 C)-97.7 F (36.5 C)] 97.5 F (36.4 C) (06/05 0981) Pulse Rate:  [82-90] 82 (06/05 0632) Resp:  [16-18] 18 (06/05 0632) BP: (102-114)/(55-65) 102/65 mmHg (06/05 0632) SpO2:  [93 %-98 %] 93 % (06/05 0632) Weight:  [57.7 kg (127 lb 3.3 oz)-60.8 kg (134 lb 0.6 oz)] 60.8 kg (134 lb 0.6 oz) (06/05 1914) Weight change:     Intake/Output from previous day: 06/04 0701 - 06/05 0700 In: 240 [P.O.:240] Out: -   PHYSICAL EXAM General appearance: alert, cooperative and moderate distress Resp: clear to auscultation bilaterally Cardio: regular rate and rhythm, S1, S2 normal, no murmur, click, rub or gallop GI: soft, non-tender; bowel sounds normal; no masses,  no organomegaly Extremities: extremities normal, atraumatic, no cyanosis or edema  Lab Results:    Basic Metabolic Panel:  Recent Labs  78/29/56 1352 11/01/12 0510  NA 138 138  K 3.0* 3.6  CL 97 101  CO2 27 29  GLUCOSE 160* 97  BUN 42* 32*  CREATININE 1.55* 1.29*  CALCIUM 9.2 8.7   Liver Function Tests:  Recent Labs  10/31/12 1352  AST 18  ALT 9  ALKPHOS 108  BILITOT 0.1*  PROT 6.9  ALBUMIN 3.4*   No results found for this basename: LIPASE, AMYLASE,  in the last 72 hours No results found for this basename: AMMONIA,  in the last 72 hours CBC:  Recent Labs  10/31/12 1352 11/01/12 0510  WBC 5.7 4.6  NEUTROABS 3.6 1.8  HGB 7.6* 7.1*  HCT 25.3* 23.9*  MCV 84.3 85.4  PLT 378 344   Cardiac Enzymes: No results found for this basename: CKTOTAL, CKMB, CKMBINDEX, TROPONINI,  in the last 72 hours BNP: No results found for this basename: PROBNP,  in the last 72 hours D-Dimer: No results found for this basename: DDIMER,  in the last 72 hours CBG: No results found for this basename: GLUCAP,  in the last 72  hours Hemoglobin A1C: No results found for this basename: HGBA1C,  in the last 72 hours Fasting Lipid Panel: No results found for this basename: CHOL, HDL, LDLCALC, TRIG, CHOLHDL, LDLDIRECT,  in the last 72 hours Thyroid Function Tests: No results found for this basename: TSH, T4TOTAL, FREET4, T3FREE, THYROIDAB,  in the last 72 hours Anemia Panel:  Recent Labs  10/31/12 1352  VITAMINB12 698  FOLATE 14.2  FERRITIN 24  TIBC 406  IRON 17*  RETICCTPCT 2.3   Coagulation: No results found for this basename: LABPROT, INR,  in the last 72 hours Urine Drug Screen: Drugs of Abuse  No results found for this basename: labopia, cocainscrnur, labbenz, amphetmu, thcu, labbarb    Alcohol Level: No results found for this basename: ETH,  in the last 72 hours Urinalysis: No results found for this basename: COLORURINE, APPERANCEUR, LABSPEC, PHURINE, GLUCOSEU, HGBUR, BILIRUBINUR, KETONESUR, PROTEINUR, UROBILINOGEN, NITRITE, LEUKOCYTESUR,  in the last 72 hours Misc. Labs:  ABGS No results found for this basename: PHART, PCO2, PO2ART, TCO2, HCO3,  in the last 72 hours CULTURES No results found for this or any previous visit (from the past 240 hour(s)). Studies/Results: Dg Chest 1 View  10/31/2012   *RADIOLOGY REPORT*  Clinical Data: Evaluate for CHF.  CHEST - 1 VIEW  Comparison: 08/11/2012  Findings: Very large hiatal hernia is again noted.  The  heart size appears within normal limits.  There is no pleural effusion visible.  No airspace consolidation noted.  Postsurgical changes involving the right humerus identified.  IMPRESSION:  1.  Large hiatal hernia. 2.  No acute cardiopulmonary abnormalities. No specific features to suggest CHF.   Original Report Authenticated By: Signa Kell, M.D.   US Abdomen Complete  10/31/2012   *RADIOLOGY REPORT*  Clinical Data:  Abdominal pain.  Left flank pain.  History of hernia surgery.  Hyperlipidemia.  COMPLETE ABDOMINAL ULTRASOUND  Comparison:  CT 07/18/2012.   Findings:  Gallbladder:  No gallstones, gallbladder wall thickening, or pericholecystic fluid.  Common bile duct:  9 mm which is within normal limits for age.  No common duct stone identified.  Liver:  No focal lesion identified.  Within normal limits in parenchymal echogenicity.  IVC:  Appears normal.  Pancreas:  Tail obscured by overlying bowel gas.  Spleen:  6.2 cm.  Suboptimally visualized due to overlying bowel gas.  No gross abnormality.  Right Kidney:  Renal cortical thinning compatible with medical renal disease.  9 mm echogenic focus in the inferior renal pole. This may represent milk of calcium layering within the cyst or echogenic/proteinaceous debris.  Left Kidney:  11 cm.  Cortical thinning and increased echogenicity compatible with medical renal disease.  Left upper pole renal cyst is present with increased through transmission.  Abdominal aorta:  No aneurysm identified.  IMPRESSION:  1.  No acute abnormality.  Negative for cholelithiasis. 2.  Echogenic lesion in the right inferior renal pole probably represents hemorrhagic or proteinaceous cyst.  Reasonable follow-up would be a repeat renal ultrasound in 6 months.  This was probably a proteinaceous cyst on the prior CT. Simple left renal cyst. 3.  Renal cortical thinning compatible with medical renal disease.   Original Report Authenticated By: Andreas Newport, M.D.    Medications:  Prior to Admission:  Prescriptions prior to admission  Medication Sig Dispense Refill  . aspirin EC 81 MG tablet Take 81 mg by mouth daily.      . carvedilol (COREG) 3.125 MG tablet Take 3.125 mg by mouth 2 (two) times daily with a meal.      . escitalopram (LEXAPRO) 20 MG tablet Take 20 mg by mouth daily.      Marland Kitchen gabapentin (NEURONTIN) 100 MG capsule Take 100 mg by mouth at bedtime.      Marland Kitchen HYDROcodone-acetaminophen (NORCO/VICODIN) 5-325 MG per tablet Take 2 tablets by mouth 2 (two) times daily. Take 2 tablets at 7 am and 11 pm      . HYDROcodone-acetaminophen  (NORCO/VICODIN) 5-325 MG per tablet Take 1 tablet by mouth every 4 (four) hours as needed for pain.      Marland Kitchen lidocaine (LIDODERM) 5 % Place 1 patch onto the skin daily. Remove & Discard patch within 12 hours or as directed by MD      . mirtazapine (REMERON) 15 MG tablet Take 15 mg by mouth at bedtime.      . pantoprazole (PROTONIX) 40 MG tablet Take 40 mg by mouth daily.      . polyethylene glycol (MIRALAX / GLYCOLAX) packet Take 17 g by mouth daily.      . potassium chloride SA (K-DUR,KLOR-CON) 20 MEQ tablet Take 20 mEq by mouth 2 (two) times daily.      Marland Kitchen torsemide (DEMADEX) 100 MG tablet Take 50 mg by mouth daily.      . Travoprost, BAK Free, (TRAVATAN) 0.004 % SOLN ophthalmic solution Place 1 drop  into both eyes at bedtime.       Scheduled: . carvedilol  3.125 mg Oral BID WC  . enoxaparin (LOVENOX) injection  30 mg Subcutaneous Q24H  . escitalopram  20 mg Oral Daily  . gabapentin  100 mg Oral QHS  . lidocaine  1 patch Transdermal Q24H  . mirtazapine  15 mg Oral QHS  . pantoprazole  40 mg Oral Daily  . polyethylene glycol  17 g Oral Daily  . potassium chloride  20 mEq Oral BID  . torsemide  50 mg Oral Daily  . Travoprost (BAK Free)  1 drop Both Eyes QHS   Continuous: . sodium chloride 50 mL/hr at 10/31/12 1455   XLK:GMWNUUVOZDGUY, acetaminophen, alum & mag hydroxide-simeth, HYDROcodone-acetaminophen, ondansetron (ZOFRAN) IV, ondansetron, traZODone  Assesment: She was admitted with weakness, failure to thrive and flank pain. She had multiple surgeries regarding a very large hiatal hernia with gastric outlet obstruction. She had a feeding tube placed. This is been removed. She has flank pain since then. No evidence of any sort of abscess on ultrasound. Her hemoglobin level was 7.1 and since she has a history of cardiac disease I think she's going to need to have a blood transfusion Active Problems:   * No active hospital problems. *    Plan: She will receive 2 units of packed red  blood cells. I will check anemia profile although I think this is probably iron deficiency. She will have GI consultation.    LOS: 1 day   [name] 11/01/2012, 9:01 AM

## 2012-11-01 NOTE — H&P (Signed)
[name], [name]               ACCOUNT NO.:  [id number]  MEDICAL RECORD NO.:  [id number]  LOCATION:  A328                          FACILITY:  [facility]  PHYSICIAN:  Frank Pilger L. Juanetta Gallegos, M.D.DATE OF BIRTH:  02/11/1920  DATE OF ADMISSION:  10/31/2012 DATE OF DISCHARGE:  LH                             HISTORY & PHYSICAL   REASON FOR ADMISSION:  Weakness, failure to thrive, flank pain.  HISTORY:  [name] is a 79 year old, who came in the office on the day of admission with complaints as above.  [name] has a past medical history significantly include a herniated area inside the hiatal hernia and [name] had surgery for this and had a feeding tube placed.  As best I can tell, [name] then decided to have the feeding tube removed and came home.  [name] had been in a skilled care facility.  It was strongly suggested to her that [name] go to an assisted living facility, which [name] refused.  When [name] came to my office, [name] was found to be weak.  [name] was complaining of flank pain.  [name] has a significant past medical history otherwise that includes coronary artery occlusive disease, congestive heart failure, hypertension, the severe problem with the hernia, aortic stenosis, hyperlipidemia, mitral regurgitation, anxiety and depression, and chronic back pain.  Surgically, [name] has had surgery for the internal hiatal hernia.  [name] had a feeding tube.  [name] has had right shoulder surgery.  [name] has had hip surgery and a hysterectomy.  SOCIAL HISTORY:  [name] has recently left a skilled care facility and lives at home alone, but has family member who stays around her.  [name] does not smoke.  [name] does not use any alcohol.  [name] does not use any illicit drugs.  REVIEW OF SYSTEMS:  Except as mentioned is essentially negative.  [name] is complaining of the flank pain.  [name] is very weak and has a lot of difficulty walking.  PHYSICAL EXAMINATION:  GENERAL:  Shows that [name] is a elderly female, who appears to be acutely and  chronically sick. HEENT:  Her pupils are reactive.  Nose and throat are clear.  Mucous membranes are moist. NECK:  Supple without masses.  [name] does not have any JVD. CHEST:  Pretty clear. HEART:  Her heart is regular without gallop. ABDOMEN:  Soft.  [name] has tenderness in the left flank, but no bruising, no mass.  Bowel sounds are present and active. EXTREMITIES:  No edema. CNS:  Grossly intact.  [name] has very difficult time ambulating in the office.  LABORATORY WORK:  Shows that [name] is markedly anemic.  So, I needed to get her last laboratory work from [facility].  [name] still have stool for blood.  [name] will have __________ replacement and continue with other treatments.     [name], M.D.     ELH/MEDQ  D:  10/31/2012  T:  11/01/2012  Job:  [id number]

## 2012-11-01 NOTE — Progress Notes (Signed)
INITIAL NUTRITION ASSESSMENT  DOCUMENTATION CODES Per approved criteria  -Not Applicable   INTERVENTION:  Ensure Complete po BID, each supplement provides 350 kcal and 13 grams of protein.  RD will follow for nutrition care plan  NUTRITION DIAGNOSIS: Inadequate oral intake related to increased weakness as evidenced by pt reported increased weakness, and inability to eat or drink for several days prior to admission.  Goal: Pt to meet >/= 90% of their estimated nutrition needs  Monitor:  Po intake, labs and wt trends  Reason for Assessment: Malnutrition Screen= 3  79 y.o. female  Admitting: c/o weakness, FTT, flank pain  ASSESSMENT: Pt hospitalized 07/18/12 acute gatric volvulus. Hx of large hiatal hernia. Had EGD 08/11/12 and then was transferred to [facility] for further evaluation due to family request. Feeding tube was placed but never used per pt. She lived at [facility] for a few weeks then d/c home. At home she was unable to eat or drink for several days due to increased weakness.  Pt found to be anemic and is receiving 2 units blood today. Her diet is 2 gr Sodium and says that she ate well today >50% of her meal. Pt had been receiving Ensure at [facility] and would like to resume during her hospitalization. Will continue to follow and evaluate adequacy of nutrition intake.  Height: Ht Readings from Last 1 Encounters:  10/31/12 5\' 3"  (1.6 m)    Weight: Wt Readings from Last 1 Encounters:  11/01/12 134 lb 0.6 oz (60.8 kg)    Ideal Body Weight: 115# (52.2 kg)  % Ideal Body Weight: 116%  Wt Readings from Last 10 Encounters:  11/01/12 134 lb 0.6 oz (60.8 kg)  08/11/12 134 lb 14.7 oz (61.2 kg)  08/11/12 134 lb 14.7 oz (61.2 kg)  07/21/12 132 lb 11.2 oz (60.192 kg)    Usual Body Weight: 132-134#  % Usual Body Weight: 100%  BMI:  Body mass index is 23.75 kg/(m^2).Normal range  Estimated Nutritional Needs: Kcal: 0454-0981 Protein: 60-70 gr Fluid: 1800  ml/day  Skin: skin tear  Diet Order: Sodium Restricted  EDUCATION NEEDS: -Education needs addressed   Intake/Output Summary (Last 24 hours) at 11/01/12 1324 Last data filed at 10/31/12 1700  Gross per 24 hour  Intake    240 ml  Output      0 ml  Net    240 ml    Last BM: PTA  Labs:   Recent Labs Lab 10/31/12 1352 11/01/12 0510  NA 138 138  K 3.0* 3.6  CL 97 101  CO2 27 29  BUN 42* 32*  CREATININE 1.55* 1.29*  CALCIUM 9.2 8.7  GLUCOSE 160* 97    CBG (last 3)  No results found for this basename: GLUCAP,  in the last 72 hours  Scheduled Meds: . carvedilol  3.125 mg Oral BID WC  . enoxaparin (LOVENOX) injection  30 mg Subcutaneous Q24H  . escitalopram  20 mg Oral Daily  . gabapentin  100 mg Oral QHS  . lidocaine  1 patch Transdermal Q24H  . mirtazapine  15 mg Oral QHS  . pantoprazole  40 mg Oral Daily  . polyethylene glycol  17 g Oral Daily  . potassium chloride  20 mEq Oral BID  . torsemide  50 mg Oral Daily  . Travoprost (BAK Free)  1 drop Both Eyes QHS    Continuous Infusions: . sodium chloride 50 mL/hr at 10/31/12 1455    Past Medical History  Diagnosis Date  .  Coronary artery disease   . Hypercholesterolemia   . Hypertension   . Mitral insufficiency   . Aortic stenosis   . Hiatal hernia   . GERD (gastroesophageal reflux disease)   . History of stomach ulcers   . Chronic headaches   . Anxiety and depression     Past Surgical History  Procedure Laterality Date  . Abdominal hysterectomy      in her 30's  . Hip fracture surgery    . Shoulder surgery Right   . Esophagogastroduodenoscopy N/A 08/12/2012    Procedure: ESOPHAGOGASTRODUODENOSCOPY (EGD);  Surgeon: Malissa Hippo, MD;  Location: [facility];  Service: Endoscopy;  Laterality: N/A;    Royann Shivers MS,RD,LDN,CSG Office: [phone] Pager: [phone]

## 2012-11-02 ENCOUNTER — Inpatient Hospital Stay (HOSPITAL_COMMUNITY): Payer: Medicare Other

## 2012-11-02 LAB — TYPE AND SCREEN: Unit division: 0

## 2012-11-02 LAB — BASIC METABOLIC PANEL
BUN: 26 mg/dL — ABNORMAL HIGH (ref 6–23)
CO2: 30 mEq/L (ref 19–32)
Chloride: 101 mEq/L (ref 96–112)
Creatinine, Ser: 1.09 mg/dL (ref 0.50–1.10)

## 2012-11-02 MED ORDER — SIMETHICONE 80 MG PO CHEW
160.0000 mg | CHEWABLE_TABLET | Freq: Three times a day (TID) | ORAL | Status: DC
  Administered 2012-11-02 – 2012-11-03 (×3): 160 mg via ORAL
  Filled 2012-11-02 (×3): qty 2

## 2012-11-02 MED ORDER — IOHEXOL 300 MG/ML  SOLN
50.0000 mL | Freq: Once | INTRAMUSCULAR | Status: AC | PRN
  Administered 2012-11-02: 50 mL via ORAL

## 2012-11-02 MED ORDER — IOHEXOL 300 MG/ML  SOLN
100.0000 mL | Freq: Once | INTRAMUSCULAR | Status: AC | PRN
  Administered 2012-11-02: 100 mL via INTRAVENOUS

## 2012-11-02 NOTE — Progress Notes (Signed)
Subjective; Patient complains of feeling. She took contrast for CT. She continues to complain of left upper quadrant abdominal pain. This pain radiates posteriorly. He also complains of being bloated. No vomiting reported. Objective; BP 141/70  Pulse 102  Temp(Src) 98.1 F (36.7 C) (Oral)  Resp 19  Ht 5\' 3"  (1.6 m)  Wt 134 lb 11.2 oz (61.1 kg)  BMI 23.87 kg/m2  SpO2 94% Abdomen is full but soft with mild tenderness at LUQ. Lab data; H&H 10.4 and 32.3. Abdominal CT reviewed with Dr. Leanna Battles earlier today. Large hiatal hernia essentially unchanged from prior study of February 2014. Small bilateral pleural effusion. 13 x 17 mm low-attenuation lesion in uncinate process there may be a second smaller lesion in the body of pancreas. Assessment; No abnormality noted on CT to account for patient's acute and chronic abdominal pain. According to patient's niece Ms. [place], she has had this pain for a few years but much worse over the last 3 weeks. Iron deficiency anemia. H&H up post transfusion. Large hiatal hernia. While surgery did not produce hernia in possibly has prevented it from twisting so that patient can swallow and not have nausea and vomiting. Recommendations; Mylicon 80 2 tablets 3 times a day order for flatulence. No further workup for IDA. Continue PPI therapy.

## 2012-11-02 NOTE — Progress Notes (Signed)
Physical Therapy Treatment Patient Details Name: [name] MRN: [id number] DOB: 27-May-1920 Today's Date: 11/02/2012 Time: 0865-7846 PT Time Calculation (min): 43 min  PT Assessment / Plan / Recommendation Comments on Treatment Session  Pt reports feeling much better this AM.  She had 2 units of bood yesterday and this seems to have helped improve her energy level.  Her overall mobility is better but I still think that she would benefit from short term SNF at d/c for increased conditioning.    Follow Up Recommendations        Does the patient have the potential to tolerate intense rehabilitation     Barriers to Discharge        Equipment Recommendations       Recommendations for Other Services    Frequency     Plan Discharge plan remains appropriate;Frequency remains appropriate    Precautions / Restrictions     Pertinent Vitals/Pain     Mobility  Bed Mobility Bed Mobility: Supine to Sit;Sit to Supine Supine to Sit: 5: Supervision;HOB flat Transfers Sit to Stand: 5: Supervision;From bed Stand to Sit: 5: Supervision;To chair/3-in-1 Details for Transfer Assistance: cues needed for proper hand placement and to pause upon standing in order to gain stability before trying to walk Ambulation/Gait Ambulation/Gait Assistance: 4: Min guard Ambulation Distance (Feet): 85 Feet Assistive device: Rolling walker Gait Pattern: Within Functional Limits Gait velocity: slow with two rests needed Stairs: No Wheelchair Mobility Wheelchair Mobility: No    Exercises General Exercises - Lower Extremity Ankle Circles/Pumps: AROM;Both;10 reps;Supine Short Arc Quad: AROM;Both;10 reps;Supine Heel Slides: AROM;Both;10 reps;Supine Hip ABduction/ADduction: AROM;Both;10 reps;Supine   PT Diagnosis:    PT Problem List:   PT Treatment Interventions:     PT Goals Acute Rehab PT Goals Pt will go Supine/Side to Sit: with modified independence PT Goal: Supine/Side to Sit - Progress: Updated  due to goal met Pt will go Sit to Stand: with modified independence PT Goal: Sit to Stand - Progress: Updated due to goal met Pt will go Stand to Sit: with modified independence PT Goal: Stand to Sit - Progress: Updated due to goals met Pt will Ambulate: 51 - 150 feet;with rolling walker PT Goal: Ambulate - Progress: Updated due to goal met  Visit Information  Last PT Received On: 11/02/12    Subjective Data  Subjective: I'm feeling much better   Cognition       Balance     End of Session PT - End of Session Equipment Utilized During Treatment: Gait belt Activity Tolerance: Patient tolerated treatment well Patient left: in chair;with call bell/phone within reach   GP     Myrlene Broker L 11/02/2012, 9:56 AM

## 2012-11-02 NOTE — Clinical Social Work Placement (Signed)
Clinical Social Work Department CLINICAL SOCIAL WORK PLACEMENT NOTE 11/02/2012  Patient:  [name], [name]  Account Number:  [id number] Admit date:  10/31/2012  Clinical Social Worker:  Derenda Fennel, LCSW  Date/time:  11/01/2012 02:00 PM  Clinical Social Work is seeking post-discharge placement for this patient at the following level of care:   SKILLED NURSING   (*CSW will update this form in Epic as items are completed)   11/01/2012  Patient/family provided with [facility] Department of Clinical Social Work's list of facilities offering this level of care within the geographic area requested by the patient (or if unable, by the patient's family).  11/01/2012  Patient/family informed of their freedom to choose among providers that offer the needed level of care, that participate in Medicare, Medicaid or managed care program needed by the patient, have an available bed and are willing to accept the patient.  11/01/2012  Patient/family informed of [facility]' ownership interest in [facility], as well as of the fact that they are under no obligation to receive care at this facility.  PASARR submitted to EDS on  PASARR number received from EDS on   FL2 transmitted to all facilities in geographic area requested by pt/family on  11/01/2012 FL2 transmitted to all facilities within larger geographic area on   Patient informed that his/her managed care company has contracts with or will negotiate with  certain facilities, including the following:     Patient/family informed of bed offers received:  11/02/2012 Patient chooses bed at [facility] Physician recommends and patient chooses bed at  [facility]  Patient to be transferred to  on   Patient to be transferred to facility by   The following physician request were entered in Epic:   Additional Comments: Pt has existing pasarr.  Derenda Fennel, [place] [phone]

## 2012-11-02 NOTE — Clinical Social Work Note (Signed)
Pt requested to fax info to [facility] for consideration. [facility] offered private room to pt as well as [facility]. Pt chooses [facility]. Facility aware of possible d/c tomorrow per MD. MD notified of above. Pt's niece involved in conversation as well and will sign paperwork at [facility] today.   Derenda Fennel, [place] [phone]

## 2012-11-02 NOTE — Plan of Care (Signed)
Problem: Phase II Progression Outcomes Goal: Progress activity as tolerated unless otherwise ordered Outcome: Progressing 11/02/12 1259 Patient ambulated with PT this morning and up to chair. Tolerated sitting up well this morning, requested back to bed on return from CT. Bedalarm on for safety. Earnstine Regal, RN

## 2012-11-03 ENCOUNTER — Inpatient Hospital Stay
Admission: RE | Admit: 2012-11-03 | Discharge: 2013-01-01 | Disposition: A | Discharge status: Home / Self Care | Payer: Medicare Other | Source: Ambulatory Visit | Attending: Pulmonary Disease | Admitting: Pulmonary Disease

## 2012-11-03 DIAGNOSIS — R627 Adult failure to thrive: Secondary | ICD-10-CM | POA: Diagnosis present

## 2012-11-03 DIAGNOSIS — R5381 Other malaise: Secondary | ICD-10-CM | POA: Diagnosis present

## 2012-11-03 DIAGNOSIS — R52 Pain, unspecified: Principal | ICD-10-CM

## 2012-11-03 DIAGNOSIS — R109 Unspecified abdominal pain: Secondary | ICD-10-CM | POA: Diagnosis present

## 2012-11-03 DIAGNOSIS — D500 Iron deficiency anemia secondary to blood loss (chronic): Principal | ICD-10-CM | POA: Diagnosis present

## 2012-11-03 DIAGNOSIS — E876 Hypokalemia: Secondary | ICD-10-CM | POA: Diagnosis present

## 2012-11-03 LAB — URINE CULTURE
Colony Count: 80000
Special Requests: NORMAL

## 2012-11-03 MED ORDER — SIMETHICONE 80 MG PO CHEW: 160.0000 mg | CHEWABLE_TABLET | Freq: Three times a day (TID) | ORAL | Status: DC

## 2012-11-03 MED ORDER — HYDROCODONE-ACETAMINOPHEN 5-325 MG PO TABS: 1.0000 | ORAL_TABLET | ORAL | Status: DC | PRN

## 2012-11-03 MED ORDER — FERROUS GLUCONATE 216 MG PO TABS: 216.0000 mg | ORAL_TABLET | Freq: Three times a day (TID) | ORAL | Status: DC

## 2012-11-03 MED ORDER — ONDANSETRON HCL 4 MG PO TABS: 4.0000 mg | ORAL_TABLET | Freq: Four times a day (QID) | ORAL | Status: DC | PRN

## 2012-11-03 MED ORDER — ENSURE COMPLETE PO LIQD: 237.0000 mL | Freq: Two times a day (BID) | ORAL | Status: AC

## 2012-11-03 MED ORDER — ENOXAPARIN SODIUM 30 MG/0.3ML ~~LOC~~ SOLN: 30.0000 mg | SUBCUTANEOUS | Status: DC

## 2012-11-03 MED ORDER — CIPROFLOXACIN HCL 250 MG PO TABS: 250.0000 mg | ORAL_TABLET | Freq: Two times a day (BID) | ORAL | Status: AC

## 2012-11-03 MED ORDER — ALUM & MAG HYDROXIDE-SIMETH 200-200-20 MG/5ML PO SUSP: 30.0000 mL | Freq: Four times a day (QID) | ORAL | Status: DC | PRN

## 2012-11-03 MED ORDER — ACETAMINOPHEN 325 MG PO TABS: 650.0000 mg | ORAL_TABLET | Freq: Four times a day (QID) | ORAL | Status: AC | PRN

## 2012-11-03 NOTE — Progress Notes (Signed)
11/03/12 1449 Patient left floor in stable condition via w/c accompanied by nurse techs. Discharged to [facility]. Earnstine Regal, RN

## 2012-11-03 NOTE — Plan of Care (Signed)
Problem: Phase II Progression Outcomes Goal: Progress activity as tolerated unless otherwise ordered Outcome: Adequate for Discharge 11/03/12 1136 Patient assisted up to chair, ambulates in room with supervision/assist. Tolerates activity fairly well, some general weakness and left flank pain worsened with activity at times per patient. Morrie Sheldon Cedricka Sackrider,RN

## 2012-11-03 NOTE — Progress Notes (Signed)
11/03/12 1209 Patient being discharged to [facility] this afternoon. IV site d/c'd and site within normal limits. Small skin tear to left forearm scabbed over, no longer any open areas or bleeding. Pt requested to have protective dressing applied so it would not open back up if she bumped her arm. Small allevyn foam dressing applied. Called report to Olegario Messier, receiving nurse at [facility]. Pt in stable condition awaiting transfer to [facility] after lunch.  Discharge prescription and d/c summary faxed as requested this morning and discharge packet to be sent with patient. Pt states she is "happy to be going to [facility] for therapy".  Earnstine Regal, RN

## 2012-11-03 NOTE — Progress Notes (Signed)
Subjective: Feels much better. No cause of her left flank and abdominal pain was found on CT. She says she feels stronger since she has received blood. She did well with physical therapy yesterday  Objective: Vital signs in last 24 hours: Temp:  [97.7 F (36.5 C)-98.1 F (36.7 C)] 97.7 F (36.5 C) (06/07 0631) Pulse Rate:  [76-102] 76 (06/07 0631) Resp:  [16-19] 18 (06/07 0631) BP: (106-141)/(55-70) 127/55 mmHg (06/07 0631) SpO2:  [91 %-98 %] 98 % (06/07 0631) Weight:  [62.1 kg (136 lb 14.5 oz)] 62.1 kg (136 lb 14.5 oz) (06/07 0500) Weight change: 1 kg (2 lb 3.3 oz) Last BM Date: 11/02/12  Intake/Output from previous day: 06/06 0701 - 06/07 0700 In: 2354.2 [P.O.:1200; I.V.:1154.2] Out: 1300 [Urine:1300]  PHYSICAL EXAM General appearance: alert, cooperative and no distress Resp: clear to auscultation bilaterally Cardio: Her heart is regular with a loud systolic murmur GI: soft, non-tender; bowel sounds normal; no masses,  no organomegaly Extremities: extremities normal, atraumatic, no cyanosis or edema  Lab Results:    Basic Metabolic Panel:  Recent Labs  16/10/96 0510 11/02/12 0502  NA 138 139  K 3.6 4.4  CL 101 101  CO2 29 30  GLUCOSE 97 105*  BUN 32* 26*  CREATININE 1.29* 1.09  CALCIUM 8.7 8.6   Liver Function Tests:  Recent Labs  10/31/12 1352  AST 18  ALT 9  ALKPHOS 108  BILITOT 0.1*  PROT 6.9  ALBUMIN 3.4*   No results found for this basename: LIPASE, AMYLASE,  in the last 72 hours No results found for this basename: AMMONIA,  in the last 72 hours CBC:  Recent Labs  10/31/12 1352 11/01/12 0510 11/01/12 2107  WBC 5.7 4.6  --   NEUTROABS 3.6 1.8  --   HGB 7.6* 7.1* 10.4*  HCT 25.3* 23.9* 32.3*  MCV 84.3 85.4  --   PLT 378 344  --    Cardiac Enzymes: No results found for this basename: CKTOTAL, CKMB, CKMBINDEX, TROPONINI,  in the last 72 hours BNP: No results found for this basename: PROBNP,  in the last 72 hours D-Dimer: No results  found for this basename: DDIMER,  in the last 72 hours CBG: No results found for this basename: GLUCAP,  in the last 72 hours Hemoglobin A1C: No results found for this basename: HGBA1C,  in the last 72 hours Fasting Lipid Panel: No results found for this basename: CHOL, HDL, LDLCALC, TRIG, CHOLHDL, LDLDIRECT,  in the last 72 hours Thyroid Function Tests: No results found for this basename: TSH, T4TOTAL, FREET4, T3FREE, THYROIDAB,  in the last 72 hours Anemia Panel:  Recent Labs  10/31/12 1352  VITAMINB12 698  FOLATE 14.2  FERRITIN 24  TIBC 406  IRON 17*  RETICCTPCT 2.3   Coagulation: No results found for this basename: LABPROT, INR,  in the last 72 hours Urine Drug Screen: Drugs of Abuse  No results found for this basename: labopia, cocainscrnur, labbenz, amphetmu, thcu, labbarb    Alcohol Level: No results found for this basename: ETH,  in the last 72 hours Urinalysis:  Recent Labs  11/01/12 1854  LABSPEC 1.010  PHURINE 6.0  GLUCOSEU NEGATIVE  HGBUR MODERATE*  BILIRUBINUR NEGATIVE  KETONESUR NEGATIVE  PROTEINUR NEGATIVE  UROBILINOGEN 0.2  NITRITE NEGATIVE  LEUKOCYTESUR TRACE*   Misc. Labs:  ABGS No results found for this basename: PHART, PCO2, PO2ART, TCO2, HCO3,  in the last 72 hours CULTURES Recent Results (from the past 240 hour(s))  URINE CULTURE  Status: None   Collection Time    11/01/12  6:54 PM      Result Value Range Status   Specimen Description URINE, CLEAN CATCH   Final   Special Requests Normal   Final   Culture  Setup Time 11/01/2012 21:00   Final   Colony Count 80,000 COLONIES/ML   Final   Culture GRAM NEGATIVE RODS   Final   Report Status PENDING   Incomplete   Studies/Results: Ct Abdomen Pelvis W Contrast  11/02/2012   *RADIOLOGY REPORT*  Clinical Data: Left upper abdominal pain.  Interval hiatal hernia repair.  CT ABDOMEN AND PELVIS WITH CONTRAST  Technique:  Multidetector CT imaging of the abdomen and pelvis was performed following  the standard protocol during bolus administration of intravenous contrast.  Contrast: 50mL OMNIPAQUE IOHEXOL 300 MG/ML  SOLN, OMNIPAQUE IOHEXOL 300 MG/ML  SOLN  Comparison: 07/18/2012.  Findings: Lung bases show chronic changes in the lungs with small bilateral pleural effusions.  Heart is at the upper limits of normal in size to mildly enlarged.  No pericardial effusion.  There is a large hiatal hernia containing the proximal two-thirds of the stomach.  Finding is unchanged from 07/18/2012.  Low attenuation lesions in the liver measure up to 1.3 cm as before.  Liver, gallbladder and adrenal glands are otherwise unremarkable. Common bile duct measures 9 mm, within normal limits for age.  Cortical thinning in the kidneys bilaterally.  Low attenuation lesions in the kidneys measure up to 2.9 cm on the left, as before.  Spleen is unremarkable.  A heterogeneous low attenuation lesion in the uncinate process of the pancreas measures 1.3 x 1.7 cm.  There may be a second low attenuation lesion in the pancreatic body, measuring 6 mm (image 23).  Pancreatic duct measures up to 4 mm in the neck.  Distal stomach is seen within the abdomen.  Small bowel and colon are unremarkable.  Hysterectomy.  2.5 cm low attenuation lesion in the left adnexa likely arises from the left ovary and is unchanged. No pathologically enlarged lymph nodes.  Minimal atherosclerotic calcification of the arterial vasculature without abdominal aortic aneurysm.  No pathologically enlarged lymph nodes. No worrisome lytic or sclerotic lesions.  Postoperative changes are seen in the right hip.  IMPRESSION:  1.  Large hiatal hernia does not appear significantly changed from 07/18/2012, which is reportedly a preoperative examination. 2.  Small bilateral pleural effusions. 3.  Low attenuation lesions in the pancreas, as above.  Typically, given the size and appearance of the smaller lesion, follow-up would be recommended at 6 months, 12 months and then  annually for 3 years.  This is at the referring physician's discretion, given the patient's age.   Original Report Authenticated By: Leanna Battles, M.D.    Medications:  Prior to Admission:  Prescriptions prior to admission  Medication Sig Dispense Refill  . carvedilol (COREG) 3.125 MG tablet Take 3.125 mg by mouth 2 (two) times daily with a meal.      . escitalopram (LEXAPRO) 20 MG tablet Take 20 mg by mouth daily.      Marland Kitchen gabapentin (NEURONTIN) 100 MG capsule Take 100 mg by mouth at bedtime.      . lidocaine (LIDODERM) 5 % Place 1 patch onto the skin daily. Remove & Discard patch within 12 hours or as directed by MD      . mirtazapine (REMERON) 15 MG tablet Take 15 mg by mouth at bedtime.      . pantoprazole (  PROTONIX) 40 MG tablet Take 40 mg by mouth daily.      . polyethylene glycol (MIRALAX / GLYCOLAX) packet Take 17 g by mouth daily.      . potassium chloride SA (K-DUR,KLOR-CON) 20 MEQ tablet Take 20 mEq by mouth 2 (two) times daily.      Marland Kitchen torsemide (DEMADEX) 100 MG tablet Take 50 mg by mouth daily.      . Travoprost, BAK Free, (TRAVATAN) 0.004 % SOLN ophthalmic solution Place 1 drop into both eyes at bedtime.      . [DISCONTINUED] aspirin EC 81 MG tablet Take 81 mg by mouth daily.      . [DISCONTINUED] HYDROcodone-acetaminophen (NORCO/VICODIN) 5-325 MG per tablet Take 2 tablets by mouth 2 (two) times daily. Take 2 tablets at 7 am and 11 pm      . [DISCONTINUED] HYDROcodone-acetaminophen (NORCO/VICODIN) 5-325 MG per tablet Take 1 tablet by mouth every 4 (four) hours as needed for pain.       Scheduled: . carvedilol  3.125 mg Oral BID WC  . enoxaparin (LOVENOX) injection  30 mg Subcutaneous Q24H  . escitalopram  20 mg Oral Daily  . feeding supplement  237 mL Oral BID BM  . gabapentin  100 mg Oral QHS  . lidocaine  1 patch Transdermal Q24H  . mirtazapine  15 mg Oral QHS  . pantoprazole  40 mg Oral Daily  . polyethylene glycol  17 g Oral Daily  . potassium chloride  20 mEq Oral BID   . simethicone  160 mg Oral TID  . torsemide  50 mg Oral Daily  . Travoprost (BAK Free)  1 drop Both Eyes QHS   Continuous: . sodium chloride 50 mL/hr at 11/02/12 1850   XBJ:[id number], acetaminophen, alum & mag hydroxide-simeth, HYDROcodone-acetaminophen, ondansetron (ZOFRAN) IV, ondansetron, traZODone  Assesment: She is much improved. Her abdominal pain is about the same and no definite cause has been found. She is physically deconditioned and needs rehabilitation and plans are for her to go to skilled care facility for rehabilitation Active Problems:   Stenosis of aortic valve   CAD (coronary artery disease)   GERD (gastroesophageal reflux disease)   Hiatal hernia   HTN (hypertension)   Mitral insufficiency   Anxiety and depression   Failure to thrive in adult   Physical deconditioning   Iron deficiency anemia secondary to blood loss (chronic)   Hypokalemia    Plan: Transfer to skilled care facility today    LOS: 3 days   [name] L 11/03/2012, 9:46 AM

## 2012-11-03 NOTE — Discharge Summary (Addendum)
Physician Discharge Summary  Patient ID: [name] MRN: [id number] DOB/AGE: [birth year] 79 y.o. Primary Care Physician:Janeli Lewison L, MD Admit date: 10/31/2012 Discharge date: 11/03/2012    Discharge Diagnoses:   Active Problems:   Stenosis of aortic valve   CAD (coronary artery disease)   GERD (gastroesophageal reflux disease)   Hiatal hernia   HTN (hypertension)   Mitral insufficiency   Anxiety and depression   Failure to thrive in adult   Physical deconditioning   Iron deficiency anemia secondary to blood loss (chronic)   Hypokalemia   Abdominal  pain, other specified site     Medication List    STOP taking these medications       aspirin EC 81 MG tablet      TAKE these medications       acetaminophen 325 MG tablet  Commonly known as:  TYLENOL  Take 2 tablets (650 mg total) by mouth every 6 (six) hours as needed.     alum & mag hydroxide-simeth 200-200-20 MG/5ML suspension  Commonly known as:  MAALOX/MYLANTA  Take 30 mLs by mouth every 6 (six) hours as needed.     carvedilol 3.125 MG tablet  Commonly known as:  COREG  Take 3.125 mg by mouth 2 (two) times daily with a meal.     enoxaparin 30 MG/0.3ML injection  Commonly known as:  LOVENOX  Inject 0.3 mLs (30 mg total) into the skin daily.     escitalopram 20 MG tablet  Commonly known as:  LEXAPRO  Take 20 mg by mouth daily.     feeding supplement Liqd  Take 237 mLs by mouth 2 (two) times daily between meals.     ferrous gluconate 216 MG tablet  Commonly known as:  FERGON  Take 1 tablet (216 mg total) by mouth 3 (three) times daily with meals.     gabapentin 100 MG capsule  Commonly known as:  NEURONTIN  Take 100 mg by mouth at bedtime.     HYDROcodone-acetaminophen 5-325 MG per tablet  Commonly known as:  NORCO/VICODIN  Take 1-2 tablets by mouth every 4 (four) hours as needed.     lidocaine 5 %  Commonly known as:  LIDODERM  Place 1 patch onto the skin daily. Remove & Discard patch within  12 hours or as directed by MD     mirtazapine 15 MG tablet  Commonly known as:  REMERON  Take 15 mg by mouth at bedtime.     ondansetron 4 MG tablet  Commonly known as:  ZOFRAN  Take 1 tablet (4 mg total) by mouth every 6 (six) hours as needed for nausea.     pantoprazole 40 MG tablet  Commonly known as:  PROTONIX  Take 40 mg by mouth daily.     polyethylene glycol packet  Commonly known as:  MIRALAX / GLYCOLAX  Take 17 g by mouth daily.     potassium chloride SA 20 MEQ tablet  Commonly known as:  K-DUR,KLOR-CON  Take 20 mEq by mouth 2 (two) times daily.     simethicone 80 MG chewable tablet  Commonly known as:  MYLICON  Chew 2 tablets (160 mg total) by mouth 3 (three) times daily.     torsemide 100 MG tablet  Commonly known as:  DEMADEX  Take 50 mg by mouth daily.     Travoprost (BAK Free) 0.004 % Soln ophthalmic solution  Commonly known as:  TRAVATAN  Place 1 drop into both eyes at bedtime.  Discharged Condition: Improved    Consults: Gastroenterology, Dr. Karilyn Cota  Significant Diagnostic Studies: Dg Chest 1 View  10/31/2012   *RADIOLOGY REPORT*  Clinical Data: Evaluate for CHF.  CHEST - 1 VIEW  Comparison: 08/11/2012  Findings: Very large hiatal hernia is again noted.  The heart size appears within normal limits.  There is no pleural effusion visible.  No airspace consolidation noted.  Postsurgical changes involving the right humerus identified.  IMPRESSION:  1.  Large hiatal hernia. 2.  No acute cardiopulmonary abnormalities. No specific features to suggest CHF.   Original Report Authenticated By: Signa Kell, M.D.   US Abdomen Complete  10/31/2012   *RADIOLOGY REPORT*  Clinical Data:  Abdominal pain.  Left flank pain.  History of hernia surgery.  Hyperlipidemia.  COMPLETE ABDOMINAL ULTRASOUND  Comparison:  CT 07/18/2012.  Findings:  Gallbladder:  No gallstones, gallbladder wall thickening, or pericholecystic fluid.  Common bile duct:  9 mm which is within  normal limits for age.  No common duct stone identified.  Liver:  No focal lesion identified.  Within normal limits in parenchymal echogenicity.  IVC:  Appears normal.  Pancreas:  Tail obscured by overlying bowel gas.  Spleen:  6.2 cm.  Suboptimally visualized due to overlying bowel gas.  No gross abnormality.  Right Kidney:  Renal cortical thinning compatible with medical renal disease.  9 mm echogenic focus in the inferior renal pole. This may represent milk of calcium layering within the cyst or echogenic/proteinaceous debris.  Left Kidney:  11 cm.  Cortical thinning and increased echogenicity compatible with medical renal disease.  Left upper pole renal cyst is present with increased through transmission.  Abdominal aorta:  No aneurysm identified.  IMPRESSION:  1.  No acute abnormality.  Negative for cholelithiasis. 2.  Echogenic lesion in the right inferior renal pole probably represents hemorrhagic or proteinaceous cyst.  Reasonable follow-up would be a repeat renal ultrasound in 6 months.  This was probably a proteinaceous cyst on the prior CT. Simple left renal cyst. 3.  Renal cortical thinning compatible with medical renal disease.   Original Report Authenticated By: Andreas Newport, M.D.   Ct Abdomen Pelvis W Contrast  11/02/2012   *RADIOLOGY REPORT*  Clinical Data: Left upper abdominal pain.  Interval hiatal hernia repair.  CT ABDOMEN AND PELVIS WITH CONTRAST  Technique:  Multidetector CT imaging of the abdomen and pelvis was performed following the standard protocol during bolus administration of intravenous contrast.  Contrast: 50mL OMNIPAQUE IOHEXOL 300 MG/ML  SOLN, OMNIPAQUE IOHEXOL 300 MG/ML  SOLN  Comparison: 07/18/2012.  Findings: Lung bases show chronic changes in the lungs with small bilateral pleural effusions.  Heart is at the upper limits of normal in size to mildly enlarged.  No pericardial effusion.  There is a large hiatal hernia containing the proximal two-thirds of the stomach.   Finding is unchanged from 07/18/2012.  Low attenuation lesions in the liver measure up to 1.3 cm as before.  Liver, gallbladder and adrenal glands are otherwise unremarkable. Common bile duct measures 9 mm, within normal limits for age.  Cortical thinning in the kidneys bilaterally.  Low attenuation lesions in the kidneys measure up to 2.9 cm on the left, as before.  Spleen is unremarkable.  A heterogeneous low attenuation lesion in the uncinate process of the pancreas measures 1.3 x 1.7 cm.  There may be a second low attenuation lesion in the pancreatic body, measuring 6 mm (image 23).  Pancreatic duct measures up to 4 mm in  the neck.  Distal stomach is seen within the abdomen.  Small bowel and colon are unremarkable.  Hysterectomy.  2.5 cm low attenuation lesion in the left adnexa likely arises from the left ovary and is unchanged. No pathologically enlarged lymph nodes.  Minimal atherosclerotic calcification of the arterial vasculature without abdominal aortic aneurysm.  No pathologically enlarged lymph nodes. No worrisome lytic or sclerotic lesions.  Postoperative changes are seen in the right hip.  IMPRESSION:  1.  Large hiatal hernia does not appear significantly changed from 07/18/2012, which is reportedly a preoperative examination. 2.  Small bilateral pleural effusions. 3.  Low attenuation lesions in the pancreas, as above.  Typically, given the size and appearance of the smaller lesion, follow-up would be recommended at 6 months, 12 months and then annually for 3 years.  This is at the referring physician's discretion, given the patient's age.   Original Report Authenticated By: Leanna Battles, M.D.    Lab Results: Basic Metabolic Panel:  Recent Labs  16/10/96 0510 11/02/12 0502  NA 138 139  K 3.6 4.4  CL 101 101  CO2 29 30  GLUCOSE 97 105*  BUN 32* 26*  CREATININE 1.29* 1.09  CALCIUM 8.7 8.6   Liver Function Tests:  Recent Labs  10/31/12 1352  AST 18  ALT 9  ALKPHOS 108  BILITOT  0.1*  PROT 6.9  ALBUMIN 3.4*     CBC:  Recent Labs  10/31/12 1352 11/01/12 0510 11/01/12 2107  WBC 5.7 4.6  --   NEUTROABS 3.6 1.8  --   HGB 7.6* 7.1* 10.4*  HCT 25.3* 23.9* 32.3*  MCV 84.3 85.4  --   PLT 378 344  --     Recent Results (from the past 240 hour(s))  URINE CULTURE     Status: None   Collection Time    11/01/12  6:54 PM      Result Value Range Status   Specimen Description URINE, CLEAN CATCH   Final   Special Requests Normal   Final   Culture  Setup Time 11/01/2012 21:00   Final   Colony Count 80,000 COLONIES/ML   Final   Culture GRAM NEGATIVE RODS   Final   Report Status PENDING   Incomplete     Hospital Course: This is a 79 year old who came to my office on the day of admission with complaints of weakness and abdominal discomfort. She had been hospitalized here about 3 months ago and was found to have a very large hiatal hernia with gastric outlet obstruction. She went to [facility] where she had surgery and although the hernia could not be totally reduced it was stabilized so that she would not get volvulus and gastric outlet obstruction again. She had a feeding tube which has been removed. She went to a skilled care facility following her surgery and has come home about 3 or 4 days ago. She is not doing well at home she is very weak she is unable to manage her activities of daily living. When I saw her she looked exceptionally weak she was barely able to ambulate she was complaining of pain in her abdomen and she was very pale. When she was admitted she was found to have marked anemia and she was hypokalemic. She had ultrasound of the abdomen which did not show a cause of her flank pain and she had CT of the abdomen which also did not show a cause of her flank and epigastric pain. This is  to some extent acute on chronic. She had blood transfusion and her hemoglobin level was good her potassium was replaced and was back to normal and she was much  better. She understood that she still needed a rehabilitation. Plans are for her to return to a skilled care facility for rehabilitation.  Discharge Exam: Blood pressure 127/55, pulse 76, temperature 97.7 F (36.5 C), temperature source Oral, resp. rate 18, height 5\' 3"  (1.6 m), weight 62.1 kg (136 lb 14.5 oz), SpO2 98.00%. She is awake and alert. Her chest is clear. She looks comfortable. She has a systolic heart murmur. Her abdomen is soft.  Disposition: To skilled care facility. Medications will be as above. She is a CBC and basic metabolic profile on 11/05/2012. She will be on a heart healthy diet. She will have physical therapy occupational therapy and speech therapy as needed. Urine culture is preliminary but shows gram-negative rods. She will be started on antibiotic awaiting the final result      Discharge Orders   Future Orders Complete By Expires     Discharge to SNF when bed available  As directed          Signed: Lorne Winkels L Pager [phone]  11/03/2012, 9:50 AM

## 2012-11-18 NOTE — H&P (Signed)
[name], [name]               ACCOUNT NO.:  [id number]  MEDICAL RECORD NO.:  [id number]  LOCATION:  S102                          FACILITY:  [facility]  PHYSICIAN:  Malay Fantroy L. Juanetta Gallegos, M.D.DATE OF BIRTH:  03/31/1920  DATE OF ADMISSION:  11/03/2012 DATE OF DISCHARGE:  [facility]                             HISTORY & PHYSICAL   HISTORY OF PRESENT ILLNESS:  The patient at the [facility] [name] is at the [facility] because of anemia, failure to thrive, coronary artery occlusive disease, hypertension, physical deconditioning, and iron-deficiency anemia.  She has been having abdominal discomfort in the flank.  She says she is still having left upper quadrant abdominal pain.  She is doing a little bit better with her deconditioning.  She had a hiatal hernia and had gastric out obstruction related to that.  She still got some abdominal discomfort.  PAST MEDICAL HISTORY:  Positive for coronary artery disease, hyperlipidemia, hypertension, mitral insufficiency, and aortic stenosis. The hiatal hernia, gastroesophageal reflux disease, history of ulcers, chronic headaches, and anxiety, and depression.  PAST SURGICAL HISTORY:  She had surgery for the hiatal hernia, but it could not be totally repaired.  She has had a hysterectomy, hip fracture surgery, shoulder surgery, and an EGD.  SOCIAL HISTORY:  She is a widow.  She does not smoke.  She does not use any alcohol.  FAMILY HISTORY:  Not really contributory at her advanced age.  PHYSICAL EXAMINATION:  GENERAL:  She is a well-developed, well-nourished female who is in no acute distress.  She was pale earlier, but looks better now.  She is sitting in a wheelchair. HEENT:  Her pupils are reactive.  Nose and throat are clear.  Mucous membranes are moist. NECK:  Supple. CHEST:  Clear. HEART:  Regular with a systolic murmur. ABDOMEN:  Soft without masses. EXTREMITIES:  No edema. CENTRAL NERVOUS SYSTEM:  Grossly  intact.  ASSESSMENT:  She is better.  She had admission to the nursing home for reconditioning.  It is not totally clear to me that she is going to be able to maintain totally independent living.     Son [name], M.D.     ELH/MEDQ  D:  11/18/2012  T:  11/18/2012  Job:  [id number]

## 2012-11-27 ENCOUNTER — Ambulatory Visit (HOSPITAL_COMMUNITY)
Admit: 2012-11-27 | Discharge: 2012-11-27 | Disposition: A | Discharge status: Home / Self Care | Payer: Medicare Other | Source: Ambulatory Visit | Attending: Pulmonary Disease | Admitting: Pulmonary Disease

## 2012-11-27 DIAGNOSIS — M5137 Other intervertebral disc degeneration, lumbosacral region: Secondary | ICD-10-CM | POA: Insufficient documentation

## 2012-11-27 DIAGNOSIS — Q762 Congenital spondylolisthesis: Secondary | ICD-10-CM | POA: Insufficient documentation

## 2012-11-27 DIAGNOSIS — M51379 Other intervertebral disc degeneration, lumbosacral region without mention of lumbar back pain or lower extremity pain: Secondary | ICD-10-CM | POA: Insufficient documentation

## 2012-11-27 DIAGNOSIS — R079 Chest pain, unspecified: Secondary | ICD-10-CM | POA: Insufficient documentation

## 2012-12-10 NOTE — Progress Notes (Signed)
I saw her on the 13th. She is ready for transfer to an assisted living facility. She still complains of left flank pain and we have not found a cause of that despite multiple evaluations. She has heart failure, coronary artery occlusive disease, adult failure to thrive, and a large obstructing hiatal hernia and has improved with rehabilitation  She is awake and alert. Her chest is clear. Her heart is regular. Her abdomen is soft  She has multiple medical problems. I think the best thing for her is going to be to go to an assisted living facility but she is angry about that. I do think that she should do that and at least at this point she agrees

## 2013-01-10 ENCOUNTER — Inpatient Hospital Stay (HOSPITAL_COMMUNITY)
Admission: EM | Admit: 2013-01-10 | Discharge: 2013-01-12 | DRG: 315 | Disposition: A | Discharge status: Nursing Home (Includes ICF) | Payer: Medicare Other | Attending: Pulmonary Disease | Admitting: Pulmonary Disease

## 2013-01-10 ENCOUNTER — Emergency Department (HOSPITAL_COMMUNITY): Payer: Medicare Other

## 2013-01-10 ENCOUNTER — Encounter (HOSPITAL_COMMUNITY): Payer: Self-pay | Admitting: *Deleted

## 2013-01-10 DIAGNOSIS — R55 Syncope and collapse: Secondary | ICD-10-CM | POA: Diagnosis present

## 2013-01-10 DIAGNOSIS — I959 Hypotension, unspecified: Principal | ICD-10-CM | POA: Diagnosis present

## 2013-01-10 DIAGNOSIS — Z6825 Body mass index (BMI) 25.0-25.9, adult: Secondary | ICD-10-CM

## 2013-01-10 DIAGNOSIS — I509 Heart failure, unspecified: Secondary | ICD-10-CM | POA: Diagnosis present

## 2013-01-10 DIAGNOSIS — F32A Depression, unspecified: Secondary | ICD-10-CM | POA: Diagnosis present

## 2013-01-10 DIAGNOSIS — N179 Acute kidney failure, unspecified: Secondary | ICD-10-CM | POA: Diagnosis present

## 2013-01-10 DIAGNOSIS — I2510 Atherosclerotic heart disease of native coronary artery without angina pectoris: Secondary | ICD-10-CM | POA: Diagnosis present

## 2013-01-10 DIAGNOSIS — F411 Generalized anxiety disorder: Secondary | ICD-10-CM | POA: Diagnosis present

## 2013-01-10 DIAGNOSIS — F3289 Other specified depressive episodes: Secondary | ICD-10-CM | POA: Diagnosis present

## 2013-01-10 DIAGNOSIS — I252 Old myocardial infarction: Secondary | ICD-10-CM

## 2013-01-10 DIAGNOSIS — N183 Chronic kidney disease, stage 3 unspecified: Secondary | ICD-10-CM | POA: Diagnosis present

## 2013-01-10 DIAGNOSIS — F329 Major depressive disorder, single episode, unspecified: Secondary | ICD-10-CM | POA: Diagnosis present

## 2013-01-10 DIAGNOSIS — M129 Arthropathy, unspecified: Secondary | ICD-10-CM | POA: Diagnosis present

## 2013-01-10 DIAGNOSIS — I10 Essential (primary) hypertension: Secondary | ICD-10-CM | POA: Diagnosis present

## 2013-01-10 DIAGNOSIS — I080 Rheumatic disorders of both mitral and aortic valves: Secondary | ICD-10-CM | POA: Diagnosis present

## 2013-01-10 DIAGNOSIS — M79602 Pain in left arm: Secondary | ICD-10-CM | POA: Diagnosis present

## 2013-01-10 DIAGNOSIS — I129 Hypertensive chronic kidney disease with stage 1 through stage 4 chronic kidney disease, or unspecified chronic kidney disease: Secondary | ICD-10-CM | POA: Diagnosis present

## 2013-01-10 DIAGNOSIS — K219 Gastro-esophageal reflux disease without esophagitis: Secondary | ICD-10-CM | POA: Diagnosis present

## 2013-01-10 DIAGNOSIS — E669 Obesity, unspecified: Secondary | ICD-10-CM | POA: Diagnosis present

## 2013-01-10 DIAGNOSIS — E7800 Pure hypercholesterolemia, unspecified: Secondary | ICD-10-CM | POA: Diagnosis present

## 2013-01-10 DIAGNOSIS — M79609 Pain in unspecified limb: Secondary | ICD-10-CM

## 2013-01-10 DIAGNOSIS — I350 Nonrheumatic aortic (valve) stenosis: Secondary | ICD-10-CM | POA: Diagnosis present

## 2013-01-10 DIAGNOSIS — D500 Iron deficiency anemia secondary to blood loss (chronic): Secondary | ICD-10-CM | POA: Diagnosis present

## 2013-01-10 DIAGNOSIS — Z8711 Personal history of peptic ulcer disease: Secondary | ICD-10-CM

## 2013-01-10 HISTORY — DX: Heart failure, unspecified: I50.9

## 2013-01-10 HISTORY — DX: Acute myocardial infarction, unspecified: I21.9

## 2013-01-10 LAB — CBC WITH DIFFERENTIAL/PLATELET
Eosinophils Absolute: 0.1 10*3/uL (ref 0.0–0.7)
Eosinophils Relative: 3 % (ref 0–5)
Hemoglobin: 10 g/dL — ABNORMAL LOW (ref 12.0–15.0)
Lymphs Abs: 1.4 10*3/uL (ref 0.7–4.0)
MCH: 29.4 pg (ref 26.0–34.0)
MCV: 92.9 fL (ref 78.0–100.0)
Monocytes Relative: 8 % (ref 3–12)
RBC: 3.4 MIL/uL — ABNORMAL LOW (ref 3.87–5.11)

## 2013-01-10 LAB — URINALYSIS, ROUTINE W REFLEX MICROSCOPIC
Bilirubin Urine: NEGATIVE
Glucose, UA: NEGATIVE mg/dL
Ketones, ur: NEGATIVE mg/dL
pH: 6 (ref 5.0–8.0)

## 2013-01-10 LAB — COMPREHENSIVE METABOLIC PANEL
ALT: 5 U/L (ref 0–35)
CO2: 34 mEq/L — ABNORMAL HIGH (ref 19–32)
Calcium: 9 mg/dL (ref 8.4–10.5)
Creatinine, Ser: 1.28 mg/dL — ABNORMAL HIGH (ref 0.50–1.10)
GFR calc Af Amer: 41 mL/min — ABNORMAL LOW (ref >90)
GFR calc non Af Amer: 35 mL/min — ABNORMAL LOW (ref >90)
Glucose, Bld: 74 mg/dL (ref 70–99)

## 2013-01-10 LAB — TROPONIN I: Troponin I: <0.3 ng/mL (ref <0.30)

## 2013-01-10 MED ORDER — TRAVOPROST (BAK FREE) 0.004 % OP SOLN
1.0000 [drp] | Freq: Every day | OPHTHALMIC | Status: DC
  Administered 2013-01-10 – 2013-01-11 (×2): 1 [drp] via OPHTHALMIC
  Filled 2013-01-10: qty 2.5

## 2013-01-10 MED ORDER — ASPIRIN 81 MG PO CHEW
324.0000 mg | CHEWABLE_TABLET | Freq: Once | ORAL | Status: AC
  Administered 2013-01-10: 324 mg via ORAL
  Filled 2013-01-10: qty 4

## 2013-01-10 MED ORDER — FERROUS SULFATE 325 (65 FE) MG PO TABS
325.0000 mg | ORAL_TABLET | Freq: Every day | ORAL | Status: DC
  Administered 2013-01-11 – 2013-01-12 (×2): 325 mg via ORAL
  Filled 2013-01-10 (×2): qty 1

## 2013-01-10 MED ORDER — FUROSEMIDE 10 MG/ML IJ SOLN
40.0000 mg | Freq: Every day | INTRAMUSCULAR | Status: DC
  Administered 2013-01-11 – 2013-01-12 (×2): 40 mg via INTRAVENOUS
  Filled 2013-01-10 (×2): qty 4

## 2013-01-10 MED ORDER — CARVEDILOL 3.125 MG PO TABS
3.1250 mg | ORAL_TABLET | Freq: Two times a day (BID) | ORAL | Status: DC
  Administered 2013-01-10 – 2013-01-11 (×2): 3.125 mg via ORAL
  Filled 2013-01-10 (×2): qty 1

## 2013-01-10 MED ORDER — TIZANIDINE HCL 2 MG PO TABS
2.0000 mg | ORAL_TABLET | Freq: Three times a day (TID) | ORAL | Status: DC
  Administered 2013-01-10 – 2013-01-12 (×6): 2 mg via ORAL
  Filled 2013-01-10 (×12): qty 1

## 2013-01-10 MED ORDER — CLONAZEPAM 0.5 MG PO TABS
0.2500 mg | ORAL_TABLET | Freq: Every day | ORAL | Status: DC
  Administered 2013-01-10 – 2013-01-11 (×2): 0.25 mg via ORAL
  Filled 2013-01-10 (×2): qty 1

## 2013-01-10 MED ORDER — MORPHINE SULFATE 2 MG/ML IJ SOLN: 1.0000 mg | INTRAMUSCULAR | Status: DC | PRN

## 2013-01-10 MED ORDER — SIMETHICONE 80 MG PO CHEW
160.0000 mg | CHEWABLE_TABLET | Freq: Three times a day (TID) | ORAL | Status: DC
  Administered 2013-01-10 – 2013-01-12 (×6): 160 mg via ORAL
  Filled 2013-01-10 (×6): qty 2

## 2013-01-10 MED ORDER — MAGNESIUM HYDROXIDE 400 MG/5ML PO SUSP: 30.0000 mL | Freq: Every day | ORAL | Status: DC | PRN

## 2013-01-10 MED ORDER — CLONAZEPAM 0.5 MG PO TABS: 0.2500 mg | ORAL_TABLET | Freq: Every day | ORAL | Status: DC | PRN

## 2013-01-10 MED ORDER — POTASSIUM CHLORIDE CRYS ER 20 MEQ PO TBCR
20.0000 meq | EXTENDED_RELEASE_TABLET | Freq: Two times a day (BID) | ORAL | Status: DC
  Administered 2013-01-10 – 2013-01-12 (×4): 20 meq via ORAL
  Filled 2013-01-10 (×5): qty 1

## 2013-01-10 MED ORDER — MIRTAZAPINE 30 MG PO TABS
15.0000 mg | ORAL_TABLET | Freq: Every day | ORAL | Status: DC
  Administered 2013-01-10 – 2013-01-11 (×2): 15 mg via ORAL
  Filled 2013-01-10: qty 1

## 2013-01-10 MED ORDER — HYDROCODONE-ACETAMINOPHEN 10-325 MG PO TABS
1.0000 | ORAL_TABLET | Freq: Two times a day (BID) | ORAL | Status: DC
  Administered 2013-01-10 – 2013-01-12 (×4): 1 via ORAL
  Filled 2013-01-10 (×4): qty 1

## 2013-01-10 MED ORDER — ACETAMINOPHEN 325 MG PO TABS: 650.0000 mg | ORAL_TABLET | Freq: Four times a day (QID) | ORAL | Status: DC | PRN

## 2013-01-10 MED ORDER — ESCITALOPRAM OXALATE 10 MG PO TABS
20.0000 mg | ORAL_TABLET | Freq: Every day | ORAL | Status: DC
  Administered 2013-01-10 – 2013-01-12 (×3): 20 mg via ORAL
  Filled 2013-01-10 (×3): qty 2

## 2013-01-10 MED ORDER — TRAZODONE HCL 50 MG PO TABS
50.0000 mg | ORAL_TABLET | Freq: Every day | ORAL | Status: DC
  Administered 2013-01-10 – 2013-01-11 (×2): 50 mg via ORAL
  Filled 2013-01-10 (×2): qty 1

## 2013-01-10 MED ORDER — ACETAMINOPHEN 650 MG RE SUPP: 650.0000 mg | Freq: Four times a day (QID) | RECTAL | Status: DC | PRN

## 2013-01-10 MED ORDER — ONDANSETRON HCL 4 MG/2ML IJ SOLN: 4.0000 mg | Freq: Four times a day (QID) | INTRAMUSCULAR | Status: DC | PRN

## 2013-01-10 MED ORDER — SODIUM CHLORIDE 0.9 % IV SOLN: 250.0000 mL | INTRAVENOUS | Status: DC | PRN

## 2013-01-10 MED ORDER — FUROSEMIDE 10 MG/ML IJ SOLN
40.0000 mg | INTRAMUSCULAR | Status: AC
  Administered 2013-01-10: 40 mg via INTRAVENOUS
  Filled 2013-01-10: qty 4

## 2013-01-10 MED ORDER — PANTOPRAZOLE SODIUM 40 MG PO TBEC
40.0000 mg | DELAYED_RELEASE_TABLET | Freq: Every day | ORAL | Status: DC
  Administered 2013-01-11 – 2013-01-12 (×2): 40 mg via ORAL
  Filled 2013-01-10 (×2): qty 1

## 2013-01-10 MED ORDER — ASPIRIN EC 325 MG PO TBEC
325.0000 mg | DELAYED_RELEASE_TABLET | Freq: Every day | ORAL | Status: DC
  Administered 2013-01-11 – 2013-01-12 (×2): 325 mg via ORAL
  Filled 2013-01-10 (×2): qty 1

## 2013-01-10 MED ORDER — SODIUM CHLORIDE 0.9 % IJ SOLN: 3.0000 mL | INTRAMUSCULAR | Status: DC | PRN

## 2013-01-10 MED ORDER — ENOXAPARIN SODIUM 30 MG/0.3ML ~~LOC~~ SOLN
30.0000 mg | SUBCUTANEOUS | Status: DC
  Administered 2013-01-10 – 2013-01-11 (×2): 30 mg via SUBCUTANEOUS
  Filled 2013-01-10 (×2): qty 0.3

## 2013-01-10 MED ORDER — ONDANSETRON HCL 4 MG PO TABS: 4.0000 mg | ORAL_TABLET | Freq: Four times a day (QID) | ORAL | Status: DC | PRN

## 2013-01-10 MED ORDER — GABAPENTIN 100 MG PO CAPS
100.0000 mg | ORAL_CAPSULE | Freq: Every day | ORAL | Status: DC
  Administered 2013-01-10 – 2013-01-11 (×2): 100 mg via ORAL
  Filled 2013-01-10 (×2): qty 1

## 2013-01-10 MED ORDER — SODIUM CHLORIDE 0.9 % IJ SOLN
3.0000 mL | Freq: Two times a day (BID) | INTRAMUSCULAR | Status: DC
  Administered 2013-01-10 – 2013-01-12 (×3): 3 mL via INTRAVENOUS

## 2013-01-10 MED ORDER — ENOXAPARIN SODIUM 40 MG/0.4ML ~~LOC~~ SOLN: 40.0000 mg | SUBCUTANEOUS | Status: DC

## 2013-01-10 MED ORDER — FLEET ENEMA 7-19 GM/118ML RE ENEM: 1.0000 | ENEMA | Freq: Once | RECTAL | Status: AC | PRN

## 2013-01-10 MED ORDER — POLYETHYLENE GLYCOL 3350 17 G PO PACK
17.0000 g | PACK | Freq: Every day | ORAL | Status: DC
  Administered 2013-01-10 – 2013-01-12 (×3): 17 g via ORAL
  Filled 2013-01-10 (×3): qty 1

## 2013-01-10 MED ORDER — ENSURE COMPLETE PO LIQD
237.0000 mL | Freq: Two times a day (BID) | ORAL | Status: DC
  Administered 2013-01-11 – 2013-01-12 (×3): 237 mL via ORAL

## 2013-01-10 MED ORDER — CLONAZEPAM 0.5 MG PO TABS: 0.2500 mg | ORAL_TABLET | ORAL | Status: DC

## 2013-01-10 MED ORDER — SODIUM CHLORIDE 0.9 % IJ SOLN
3.0000 mL | Freq: Two times a day (BID) | INTRAMUSCULAR | Status: DC
  Administered 2013-01-10: 3 mL via INTRAVENOUS

## 2013-01-10 MED ORDER — ALUM & MAG HYDROXIDE-SIMETH 200-200-20 MG/5ML PO SUSP: 30.0000 mL | Freq: Four times a day (QID) | ORAL | Status: DC | PRN

## 2013-01-10 NOTE — ED Notes (Signed)
Bedside provided at bedside per pt request prior to IV lasix administration

## 2013-01-10 NOTE — ED Provider Notes (Signed)
CSN: [id number]     Arrival date & time 01/10/13  1217 History  This chart was scribed for Regina Roller, MD by Bennett Scrape, ED Scribe. This patient was seen in room APA06/APA06 and the patient's care was started at 12:45 PM.   Chief Complaint  Patient presents with  . Near Syncope  . Arm Injury    lt arm pain    The history is provided by the patient and a relative (daughter). No language interpreter was used.    HPI Comments: [name] is a 79 y.o. female with a h/o CAD, aortic stenosis and prior MI who presents to the Emergency Department from [facility] complaining of one episode of near syncope. Daughter states that the pt had blood work drawn yesterday that showed "elevation" per daughter and a CXR that showed congestion. Daughter cannot give any further specifics. She states that the pt was at breakfast this morning and had a brief period of unresponsiveness. Pt denies syncope and states that she remembers people talking to her, she just couldn't respond for a few seconds. She reports similar symptoms during her prior MI in 2007. She reports experiencing left arm pain upon waking this morning but states that this has since resolved. Pt denies CP, SOB and back pain currently. Daughter expresses concern that the pt has appeared SOB with exertion within the past few days. Pt has a h/o extensive cardiac disease and a hiatal hernia that required extensive surgery. Daughter reports several admissions to several different hospitals.  Dr. Altamese [place] is Cards with [facility], [phone]  Past Medical History  Diagnosis Date  . Coronary artery disease   . Hypercholesterolemia   . Hypertension   . Mitral insufficiency   . Aortic stenosis   . Hiatal hernia   . GERD (gastroesophageal reflux disease)   . History of stomach ulcers   . Chronic headaches   . Anxiety and depression   . MI (myocardial infarction) 2005   Past Surgical History  Procedure  Laterality Date  . Abdominal hysterectomy      in her 30's  . Hip fracture surgery    . Shoulder surgery Right   . Esophagogastroduodenoscopy N/A 08/12/2012    Procedure: ESOPHAGOGASTRODUODENOSCOPY (EGD);  Surgeon: Malissa Hippo, MD;  Location: [facility];  Service: Endoscopy;  Laterality: N/A;   History reviewed. No pertinent family history. History  Substance Use Topics  . Smoking status: Never Smoker   . Smokeless tobacco: Not on file  . Alcohol Use: No   No OB history provided.  Review of Systems  A complete 10 system review of systems was obtained and all systems are negative except as noted in the HPI and PMH.   Allergies  Allegra; Codeine; and Naproxen  Home Medications   Current Outpatient Rx  Name  Route  Sig  Dispense  Refill  . acetaminophen (TYLENOL) 325 MG tablet   Oral   Take 2 tablets (650 mg total) by mouth every 6 (six) hours as needed.         . carvedilol (COREG) 3.125 MG tablet   Oral   Take 3.125 mg by mouth 2 (two) times daily with a meal.         . clonazePAM (KLONOPIN) 0.5 MG tablet   Oral   Take 0.25 mg by mouth See admin instructions. Take 1 tablet by mouth once daily.  May take 1 tablet by mouth as needed for anxiety.         Marland Kitchen  escitalopram (LEXAPRO) 20 MG tablet   Oral   Take 20 mg by mouth daily.         . ferrous sulfate 325 (65 FE) MG tablet   Oral   Take 325 mg by mouth daily with breakfast.         . gabapentin (NEURONTIN) 100 MG capsule   Oral   Take 100 mg by mouth at bedtime.         Marland Kitchen HYDROcodone-acetaminophen (NORCO) 10-325 MG per tablet   Oral   Take 1 tablet by mouth 2 (two) times daily.         . mirtazapine (REMERON) 15 MG tablet   Oral   Take 15 mg by mouth at bedtime.         . pantoprazole (PROTONIX) 40 MG tablet   Oral   Take 40 mg by mouth daily.         . polyethylene glycol (MIRALAX / GLYCOLAX) packet   Oral   Take 17 g by mouth daily.         . potassium chloride SA  (K-DUR,KLOR-CON) 20 MEQ tablet   Oral   Take 20 mEq by mouth 2 (two) times daily.         . simethicone (MYLICON) 80 MG chewable tablet   Oral   Chew 2 tablets (160 mg total) by mouth 3 (three) times daily.   30 tablet   0   . tiZANidine (ZANAFLEX) 2 MG tablet   Oral   Take 2 mg by mouth 3 (three) times daily.         Marland Kitchen torsemide (DEMADEX) 100 MG tablet   Oral   Take 100 mg by mouth daily.          . Travoprost, BAK Free, (TRAVATAN) 0.004 % SOLN ophthalmic solution   Both Eyes   Place 1 drop into both eyes at bedtime.         . traZODone (DESYREL) 50 MG tablet   Oral   Take 50 mg by mouth at bedtime.         . feeding supplement (ENSURE COMPLETE) LIQD   Oral   Take 237 mLs by mouth 2 (two) times daily between meals.          Triage Vitals: BP 136/58  Pulse 62  Temp(Src) 98.2 F (36.8 C) (Oral)  Resp 17  Ht 5\' 2"  (1.575 m)  Wt 140 lb (63.504 kg)  BMI 25.6 kg/m2  SpO2 95%  Physical Exam  Nursing note and vitals reviewed. Constitutional: She is oriented to person, place, and time. She appears well-developed and well-nourished. No distress.  HENT:  Head: Normocephalic and atraumatic.  Mouth/Throat: Oropharynx is clear and moist.  Eyes: Conjunctivae and EOM are normal. Pupils are equal, round, and reactive to light.  Neck: Neck supple. No tracheal deviation present.  Cardiovascular: Normal rate and regular rhythm.   Murmur (loud systolic) heard. Pulmonary/Chest: Effort normal. No respiratory distress. She has rales (scattered rales at the bases).  Musculoskeletal: Normal range of motion. She exhibits edema (mild symmetrical lower extremitiy edema).  Neurological: She is alert and oriented to person, place, and time.  Skin: Skin is warm and dry.  Psychiatric: She has a normal mood and affect. Her behavior is normal.    ED Course   Medications  aspirin chewable tablet 324 mg (324 mg Oral Given 01/10/13 1303)  furosemide (LASIX) injection 40 mg (40 mg  Intravenous Given 01/10/13 1442)    DIAGNOSTIC  STUDIES: Oxygen Saturation is 95% on room air, normal by my interpretation.    COORDINATION OF CARE: 12:47 PM-Informed pt that her EKG appears normal. Discussed treatment plan which includes ASA, CXR, CBC panel, CMP and troponin with pt at bedside and pt agreed to plan.   Procedures (including critical care time)  Labs Reviewed  CBC WITH DIFFERENTIAL - Abnormal; Notable for the following:    RBC 3.40 (*)    Hemoglobin 10.0 (*)    HCT 31.6 (*)    RDW 16.6 (*)    All other components within normal limits  COMPREHENSIVE METABOLIC PANEL - Abnormal; Notable for the following:    CO2 34 (*)    BUN 25 (*)    Creatinine, Ser 1.28 (*)    GFR calc non Af Amer 35 (*)    GFR calc Af Amer 41 (*)    All other components within normal limits  TROPONIN I  URINALYSIS, ROUTINE W REFLEX MICROSCOPIC   Dg Chest Port 1 View  01/10/2013   *RADIOLOGY REPORT*  Clinical Data: Chest pain, shortness of breath and congestion.  PORTABLE CHEST - 1 VIEW  Comparison: Chest x-ray 10/31/2012.  Findings: The heart is enlarged but stable.  There is a large stable hiatal hernia.  There is a fairly fulminant pattern of pulmonary edema and probable small bilateral pleural effusions.  IMPRESSION: Congestive heart failure.   Original Report Authenticated By: Rudie Meyer, M.D.   1. Syncope     MDM  Patient has an unchanged EKG, she does have a history significant for myocardial infarction and is on multiple medications. She will need workup included laboratory and chest x-ray to evaluate for the source of her symptoms including cardiac, metabolic and possibly infectious. Her chest x-ray is pending, her exam is consistent with possible mild edema or chest congestion however she does not appear to be in any distress.  ED ECG REPORT  I personally interpreted this EKG   Date: 01/10/2013   Rate: 62  Rhythm: normal sinus rhythm  QRS Axis: normal  Intervals: normal - PR  prolonged  ST/T Wave abnormalities: normal  Conduction Disutrbances:none  Narrative Interpretation:   Old EKG Reviewed: no sig changes since2/19/14  CXR with CHF - labs close to baseline - d/w Dr. Waymon Amato of [facility] who will admit for Dr. Juanetta Gosling.  I personally performed the services described in this documentation, which was scribed in my presence. The recorded information has been reviewed and is accurate.     Regina Roller, MD 01/10/13 [phone]

## 2013-01-10 NOTE — H&P (Signed)
[facility] History and Physical  [name]:[id number] DOB: February 27, 1920 DOA: 01/10/2013  Referring physician:  PCP: Fredirick Maudlin, MD  Specialists:   Chief Complaint:   HPI: [name] is a delightful 79 y.o. female with a past medical history that includes CAD status post MI in 2005, CHF, GERD, anxiety, aortic stenosis, hypertension, hypercholesterolemia presents to the emergency department from her facility [facility] with the chief complaint of pre-syncope. Information is obtained from the patient. She states that she was in her usual state of health until about 4:00 this morning when she awakened with left arm and left shoulder pain. She describes the pain as a constant ache and rates it about a 6/10. He denies any chest pain shortness of breath or palpitations during this time. She indicates that the nurse came in with her morning medications and gave her something for pain and shortly thereafter she felt better so she ambulated with her walker to the cafeteria for breakfast. Sometime during breakfast she reports she had a episode of unresponsiveness. She says that she did not pass out that she could hear people around her asking her if she was all right but she was unable to respond. She indicates this episode lasted "only a few seconds". During this time she denies any chest pain palpitations shortness of breath diaphoresis nausea vomiting headache visual disturbances numbness or tingling of extremities. She reports that she sat there at the breakfast table for about an hour and she began to feel better so she ambulated with her walker back to her room. She reports that there is a new physician at the facility and the episode was reported to that position and she was transported to the emergency room. Review of systems and does include lower extremity swelling for the last 4 days. She reports that her lower extremities usually are not edematous but for the last 4 days there  has been a gradual increase in swelling. She states that her left leg with arthritis has increased pain due to the swelling making ambulation difficult. She also endorses a dry cough over the last 2 days. In addition she indicates that she has been making him "less urine". She denies dysuria hematuria frequency or urgency. She states that she usually gets Demadex 100 mg daily and that recently the facility has been giving her 50 mg twice a day and she Believes that it is not as effective. She denies any constipation diarrhea melena and her last BM was yesterday and normal in consistency and color. She also indicates that she sleeps propped up on 2 pillows and has done so since her MI in 2005. She denies any shortness of breath with exertion but chart indicates that her daughter has reported some increased dyspnea the last 2 days. Lab values in the emergency department are significant for creatinine of 1.28 hemoglobin of 10.0 and a negative troponin. Chest x-ray yields congestive heart failure. EKG yields sinus rhythm with first degree block. Vital signs are stable. [facility] were asked to admit.   Review of Systems: The patient denies anorexia, fever, weight loss,, vision loss, decreased hearing, hoarseness, chest pain,  dyspnea on exertion,  balance deficits, hemoptysis, abdominal pain, melena, hematochezia, severe indigestion/heartburn, hematuria, incontinence, genital sores, muscle weakness, suspicious skin lesions, transient blindness, difficulty walking, depression, unusual weight change, abnormal bleeding, enlarged lymph nodes, angioedema, and breast masses.    Past Medical History  Diagnosis Date  . Coronary artery disease   . Hypercholesterolemia   .  Hypertension   . Mitral insufficiency   . Aortic stenosis   . Hiatal hernia   . GERD (gastroesophageal reflux disease)   . History of stomach ulcers   . Chronic headaches   . Anxiety and depression   . MI (myocardial infarction) 2005   . CHF (congestive heart failure)    Past Surgical History  Procedure Laterality Date  . Abdominal hysterectomy      in her 30's  . Hip fracture surgery    . Shoulder surgery Right   . Esophagogastroduodenoscopy N/A 08/12/2012    Procedure: ESOPHAGOGASTRODUODENOSCOPY (EGD);  Surgeon: Malissa Hippo, MD;  Location[facility];  Service: Endoscopy;  Laterality: N/A;   Social History:  reports that she has never smoked. She does not have any smokeless tobacco history on file. She reports that she does not drink alcohol or use illicit drugs. Patient is a widow and lives at [facility]. He ambulates with a walker and needs minimal assistance with ADLs. He has no children but a niece assists with healthcare managed  Allergies  Allergen Reactions  . Allegra [Fexofenadine] Nausea And Vomiting  . Codeine Nausea And Vomiting  . Naproxen Nausea Only    History reviewed. No pertinent family history. father deceased at 69 years of age from an MI, mother deceased at 59 years of age with a stroke she had one brother whose health history is positive for hypertension.  Prior to Admission medications   Medication Sig Start Date End Date Taking? Authorizing Provider  acetaminophen (TYLENOL) 325 MG tablet Take 2 tablets (650 mg total) by mouth every 6 (six) hours as needed. 11/03/12  Yes Fredirick Maudlin, MD  carvedilol (COREG) 3.125 MG tablet Take 3.125 mg by mouth 2 (two) times daily with a meal.   Yes Historical Provider, MD  clonazePAM (KLONOPIN) 0.5 MG tablet Take 0.25 mg by mouth See admin instructions. Take 1 tablet by mouth once daily.  May take 1 tablet by mouth as needed for anxiety.   Yes Historical Provider, MD  escitalopram (LEXAPRO) 20 MG tablet Take 20 mg by mouth daily.   Yes Historical Provider, MD  ferrous sulfate 325 (65 FE) MG tablet Take 325 mg by mouth daily with breakfast.   Yes Historical Provider, MD  gabapentin (NEURONTIN) 100 MG capsule Take 100 mg by mouth at bedtime.   Yes  Historical Provider, MD  HYDROcodone-acetaminophen (NORCO) 10-325 MG per tablet Take 1 tablet by mouth 2 (two) times daily.   Yes Historical Provider, MD  mirtazapine (REMERON) 15 MG tablet Take 15 mg by mouth at bedtime.   Yes Historical Provider, MD  pantoprazole (PROTONIX) 40 MG tablet Take 40 mg by mouth daily.   Yes Historical Provider, MD  polyethylene glycol (MIRALAX / GLYCOLAX) packet Take 17 g by mouth daily.   Yes Historical Provider, MD  potassium chloride SA (K-DUR,KLOR-CON) 20 MEQ tablet Take 20 mEq by mouth 2 (two) times daily.   Yes Historical Provider, MD  simethicone (MYLICON) 80 MG chewable tablet Chew 2 tablets (160 mg total) by mouth 3 (three) times daily. 11/03/12  Yes Fredirick Maudlin, MD  tiZANidine (ZANAFLEX) 2 MG tablet Take 2 mg by mouth 3 (three) times daily.   Yes Historical Provider, MD  torsemide (DEMADEX) 100 MG tablet Take 100 mg by mouth daily.    Yes Historical Provider, MD  Travoprost, BAK Free, (TRAVATAN) 0.004 % SOLN ophthalmic solution Place 1 drop into both eyes at bedtime.   Yes Historical Provider,  MD  traZODone (DESYREL) 50 MG tablet Take 50 mg by mouth at bedtime.   Yes Historical Provider, MD  feeding supplement (ENSURE COMPLETE) LIQD Take 237 mLs by mouth 2 (two) times daily between meals. 11/03/12   Fredirick Maudlin, MD   Physical Exam: Filed Vitals:   01/10/13 1400  BP: 110/45  Pulse: 62  Temp:   Resp: 24     General:  Well-nourished somewhat pale no acute distress  Eyes: PERRLA, EOMI, no scleral icterus  ENT: Ears clear nose without drainage oropharynx without erythema or exudate mucous membranes of her mouth are moist and pink  Neck: Supple no JVD full range of motion no lymphadenopathy  Cardiovascular: Regular rate and rhythm with very loud murmur. No rub. Trace to 1+ bilateral lower extremity edema. Some chronic venous changes.  Respiratory: Normal effort somewhat shallow breath sounds distant. Fine crackles bilateral bases otherwise  breath sounds are clear to auscultation no wheeze no rhonchi  Abdomen: Obese soft positive bowel sounds nontender to palpation no mass organomegaly noted  Skin: Warm and dry no rashes no lesions  Musculoskeletal: Moves all extremities no clubbing no cyanosis some tenderness to the left knee to palpation. Otherwise joints nontender nonerythematous  Psychiatric: Cooperative calm appropriate  Neurologic: Speech clear cranial nerves II through XII grossly intact fair muscle tone  Labs on Admission:  Basic Metabolic Panel:  Recent Labs Lab 01/10/13 1314  NA 140  K 4.2  CL 99  CO2 34*  GLUCOSE 74  BUN 25*  CREATININE 1.28*  CALCIUM 9.0   Liver Function Tests:  Recent Labs Lab 01/10/13 1314  AST 13  ALT 5  ALKPHOS 87  BILITOT 0.3  PROT 7.0  ALBUMIN 3.6   No results found for this basename: LIPASE, AMYLASE,  in the last 168 hours No results found for this basename: AMMONIA,  in the last 168 hours CBC:  Recent Labs Lab 01/10/13 1314  WBC 4.8  NEUTROABS 2.8  HGB 10.0*  HCT 31.6*  MCV 92.9  PLT 255   Cardiac Enzymes:  Recent Labs Lab 01/10/13 1314  TROPONINI <0.30    BNP (last 3 results) No results found for this basename: PROBNP,  in the last 8760 hours CBG: No results found for this basename: GLUCAP,  in the last 168 hours  Radiological Exams on Admission: Dg Chest Port 1 View  01/10/2013   *RADIOLOGY REPORT*  Clinical Data: Chest pain, shortness of breath and congestion.  PORTABLE CHEST - 1 VIEW  Comparison: Chest x-ray 10/31/2012.  Findings: The heart is enlarged but stable.  There is a large stable hiatal hernia.  There is a fairly fulminant pattern of pulmonary edema and probable small bilateral pleural effusions.  IMPRESSION: Congestive heart failure.   Original Report Authenticated By: Rudie Meyer, M.D.    EKG: Independently reviewed and yields sinus rhythm with first degree block.  Assessment/Plan Principal Problem:   CHF (congestive heart  failure): Per chest x-ray and in the setting of presyncopal episode and left arm and shoulder pain. Will admit to telemetry. Will continue with IV Lasix. Will monitor strict intake and output will obtain daily weights. Will get a 2-D echo to evaluate LV function. Will cycle her troponins. Continue her beta blocker. Repeat her EKG in the morning. Chart review indicates her cardiologist is Dr. Altamese Dowling with no clot heart and vascular.  Active Problems:  Syncope: Patient denies loss of consciousness but acknowledges a brief episode of unresponsiveness. Given her cardiac history concern for  cardiac etiology. See #1 therapies.     Acute renal failure: Current creatinine fairly close to what appears to be her baseline after chart review. She will be getting IV Lasix due to #1. Will hold any other nephrotoxins. Monitor her urine output. Recheck in the a.m.    Stenosis of aortic valve: See #1.    CAD (coronary artery disease): Patient denies chest pain. She did experience some left arm and shoulder pain this morning that has been resolved since pain medicine given at that time. Will continue her Coreg and aspirin.    GERD (gastroesophageal reflux disease): Stable at baseline    HTN (hypertension) controlled. Will continue her beta blocker.    Anxiety and depression: Appears stable at baseline.    Iron deficiency anemia secondary to blood loss (chronic): Chart review indicates current hemoglobin level close to baseline. No signs and symptoms of active bleeding. Will monitor   History of gastric outlet obstruction secondary to hiatel hernia s/p surgical repair.    Code Status: DNR Family Communication: None avail Disposition Plan: Back to [facility] when ready hopefully 24-48 hour  Time spent: 65 minutes  Gwenyth Bender [facility] Pager [phone]  If 7PM-7AM, please contact night-coverage [email or web address] Password [facility] 01/10/2013, 3:23 PM

## 2013-01-10 NOTE — H&P (Addendum)
Patient was interviewed and examined. Chart reviewed. Discussed with patient's niece/health care power of attorney Ms. Rosaura Carpenter at bedside. Discussed with Ms. Toya Smothers, NP. Reviewed Ms. Black's H&P note and following addendum/changes made:  Patient has extensive past medical history as per H&P. She underwent hiatal hernia surgery at [facility] but it could not be totally repaired. As per niece, patient has had a progressive gradual decline since the surgery. She has been in and out of hospitals and nursing facilities. She has been at the current nursing facility for approximately 10 days. Patient states that she has not really been able to sleep at night due to uncomfortable bed and not getting her nightly sleeping pills. Also, yesterday therapy apparently placed "noodles" on either side of her bed which were hard so that she doesn't fall. She does not recollect if she hit her arm to these. She denies prior history of left upper extremity pain. This pain is currently resolved. No chest pain or dyspnea. She also complains of increasing leg edema but no dyspnea. She states that her water pills have been reduced to low blood pressures-98/45 mmHg and she's not urinating as well as before. She states that she did not completely lose consciousness and was aware of happening surround during the second episode this morning which lasted briefly for a few seconds.  On exam, moderately built and nourished frail elderly female, pleasant and lying comfortably supine in bed without distress. Vital signs stable. RS: Slightly reduced breath sounds in the bases with occasional basal crackles but otherwise clear and no increased work of breathing. CVS: S1-S2 heard, RRR, 3/6 pansystolic murmur best heard at apex. No JVD. Trace bilateral ankle edema. CNS: Alert and oriented. No focal deficits.  Assessment and plan:  Near syncope/? Syncope: Unclear etiology. May be secondary to hypotension/orthostatic  hypotension from which patient apparently has been struggling since her hiatal hernia surgery-per niece. Monitor on telemetry. Check orthostatic blood pressures. PT and OT evaluation.  Left upper extremity pain: Resolved. DD-musculoskeletal/arthritic. Not very suspicious for anginal type pain in the absence of chest pain. However will monitor on telemetry and cycle cardiac enzymes.  Chronic CHF: unknown systolic or diastolic. Patient's primary cardiologist is Dr. Altamese [place]. Her diuretics are probably being reduced due to hypotension. Check 2-D echo. Received Lasix in the ED. Continue diuretics and follow with her cardiologist as outpatient. Don't believe that she truly has CHF exacerbation but will temporarily switch from [facility] to IV Lasix in the hospital since she gives history of decreased urination and leg edema with Demadex.  Stage III chronic kidney disease: Baseline creatinine. Follow BMP in a.m.  Chronic anemia: Seems stable. Follow CBC in a.m.   History of CAD/AS/MR   [facility] 01/10/13 5:23 PM

## 2013-01-10 NOTE — ED Notes (Signed)
Per pt's family member, the pt had a previous MI with similar symptoms approx 7 years ago.

## 2013-01-10 NOTE — ED Notes (Signed)
Pt is a resident at [facility], per staff pt had a near syncopial episode this morning with pain in lt arm. Pt denies pain or discomfort in chest, pt does have HX of CHF and Aortic Stenosis.

## 2013-01-11 DIAGNOSIS — I359 Nonrheumatic aortic valve disorder, unspecified: Secondary | ICD-10-CM

## 2013-01-11 LAB — BASIC METABOLIC PANEL
BUN: 23 mg/dL (ref 6–23)
Calcium: 8.9 mg/dL (ref 8.4–10.5)
GFR calc non Af Amer: 36 mL/min — ABNORMAL LOW (ref >90)
Glucose, Bld: 84 mg/dL (ref 70–99)

## 2013-01-11 LAB — CBC
MCH: 29.1 pg (ref 26.0–34.0)
MCHC: 31.5 g/dL (ref 30.0–36.0)
Platelets: 238 10*3/uL (ref 150–400)

## 2013-01-11 LAB — TROPONIN I: Troponin I: <0.3 ng/mL (ref <0.30)

## 2013-01-11 NOTE — Progress Notes (Signed)
Patient ID: [name], female   DOB: 1920/04/11, 79 y.o.   MRN: [id number]   CARDIOLOGY CONSULT NOTE  Patient ID: [name] MRN: [id number], DOB/AGE: 08-03-1919   Admit date: 01/10/2013 Date of Consult: 01/11/2013   Primary Physician: Fredirick Maudlin, MD Primary Cardiologist: Recardo Evangelist MD, [place], [place]  Pt. Profile  79 year old white female admitted with presyncope. I've been asked to evaluate for congestive heart failure on admission. I was asked by Dr. Juanetta Gosling to consult.  Problem List  Past Medical History  Diagnosis Date  . Coronary artery disease   . Hypercholesterolemia   . Hypertension   . Mitral insufficiency   . Aortic stenosis   . Hiatal hernia   . GERD (gastroesophageal reflux disease)   . History of stomach ulcers   . Chronic headaches   . Anxiety and depression   . MI (myocardial infarction) 2005  . CHF (congestive heart failure)     Past Surgical History  Procedure Laterality Date  . Abdominal hysterectomy      in her 30's  . Hip fracture surgery    . Shoulder surgery Right   . Esophagogastroduodenoscopy N/A 08/12/2012    Procedure: ESOPHAGOGASTRODUODENOSCOPY (EGD);  Surgeon: Malissa Hippo, MD;  Location: [facility];  Service: Endoscopy;  Laterality: N/A;     Allergies  Allergies  Allergen Reactions  . Allegra [Fexofenadine] Nausea And Vomiting  . Codeine Nausea And Vomiting  . Naproxen Nausea Only    HPI   Very sharp elderly female who recently had her diuretic dosage were held. She has a history of coronary artery disease and MI in 2005. She's also had a history of congestive heart failure. Her cardiologist is Dr. Tiburcio Pea who she has an appointment with next week. She apparently has a history of some aortic stenosis as well.  Because of low blood pressure, her diuretics have been held or  dose reduced. It is not clear from the chart. Her prehospital meds include Demadex 100 mg a day.  She denied being increasing edema of  the lower extremities for several days. Yesterday, she got up to go to the breakfast room at the nursing home. While she was sitting at a table, and she developed symptoms of presyncope. She did not go out completely and can hear people talking around her.  Her EKG on admission showed sinus rhythm with no acute changes. Her heart rate was relatively slow to be in pulmonary edema and have orthostatic hypotension. Cardiac markers have been negative x4. Her hemoglobin was 10. BUN and creatinine 25 and 1.28. Potassium is normal. Chest x-ray showed congestive heart failure with pulmonary edema. She also has bilateral pleural effusions.        Inpatient Medications  . aspirin EC  325 mg Oral Daily  . carvedilol  3.125 mg Oral BID WC  . clonazePAM  0.25 mg Oral QHS  . enoxaparin (LOVENOX) injection  30 mg Subcutaneous Q24H  . escitalopram  20 mg Oral Daily  . feeding supplement  237 mL Oral BID BM  . ferrous sulfate  325 mg Oral Q breakfast  . furosemide  40 mg Intravenous Daily  . gabapentin  100 mg Oral QHS  . HYDROcodone-acetaminophen  1 tablet Oral BID  . mirtazapine  15 mg Oral QHS  . pantoprazole  40 mg Oral Daily  . polyethylene glycol  17 g Oral Daily  . potassium chloride SA  20 mEq Oral BID  . simethicone  160 mg Oral TID  .  sodium chloride  3 mL Intravenous Q12H  . sodium chloride  3 mL Intravenous Q12H  . tiZANidine  2 mg Oral TID  . Travoprost (BAK Free)  1 drop Both Eyes QHS  . traZODone  50 mg Oral QHS    Family History History reviewed. No pertinent family history.   Social History History   Social History  . Marital Status: Widowed    Spouse Name: N/A    Number of Children: N/A  . Years of Education: N/A   Occupational History  . Not on file.   Social History Main Topics  . Smoking status: Never Smoker   . Smokeless tobacco: Not on file  . Alcohol Use: No  . Drug Use: No  . Sexual Activity: Not on file   Other Topics Concern  . Not on file   Social  History Narrative   Patient is in [place] by herself.   Her niece visits her every day to help her with groceries and other stuff.   Overall she is independent.     Review of Systems  General:  No chills, fever, night sweats or weight changes.  Cardiovascular:  No chest pain, dyspnea on exertion, , orthopnea, palpitations, paroxysmal nocturnal dyspnea. Dermatological: No rash, lesions/masses Respiratory: No cough, dyspnea Urologic: No hematuria, dysuria Abdominal:   No nausea, vomiting, diarrhea, bright red blood per rectum, melena, or hematemesis Neurologic:  No visual changes, wkns, changes in mental status. All other systems reviewed and are otherwise negative except as noted above.  Physical Exam  Blood pressure 132/65, pulse 65, temperature 97.9 F (36.6 C), temperature source Oral, resp. rate 20, height 5\' 2"  (1.575 m), weight 138 lb 12.8 oz (62.959 kg), SpO2 100.00%.  General: Pleasant, NAD, very sharp mentally Psych: Normal affect. Neuro: Alert and oriented X 3. Moves all extremities spontaneously. HEENT: Normal  Neck: Supple without bruits or JVD. Lungs:  Resp regular and unlabored, CTA. Heart: RRR no s3, s4, systolic murmur consistent with aortic stenosis. It is difficult to hear S2 split. Abdomen: Soft, non-tender, non-distended, BS + x 4.  Extremities: No clubbing, cyanosis , minimal edema DP/PT/Radials 2+ and equal bilaterally.  Labs   Recent Labs  01/10/13 1314 01/10/13 1817 01/10/13 2344 01/11/13 0618  TROPONINI <0.30 <0.30 <0.30 <0.30   Lab Results  Component Value Date   WBC 3.7* 01/11/2013   HGB 9.5* 01/11/2013   HCT 30.2* 01/11/2013   MCV 92.4 01/11/2013   PLT 238 01/11/2013    Recent Labs Lab 01/10/13 1314 01/11/13 0618  NA 140 142  K 4.2 3.8  CL 99 101  CO2 34* 35*  BUN 25* 23  CREATININE 1.28* 1.25*  CALCIUM 9.0 8.9  PROT 7.0  --   BILITOT 0.3  --   ALKPHOS 87  --   ALT 5  --   AST 13  --   GLUCOSE 74 84   No results found for  this basename: CHOL, HDL, LDLCALC, TRIG   No results found for this basename: DDIMER    Radiology/Studies  Dg Chest Port 1 View  01/10/2013   *RADIOLOGY REPORT*  Clinical Data: Chest pain, shortness of breath and congestion.  PORTABLE CHEST - 1 VIEW  Comparison: Chest x-ray 10/31/2012.  Findings: The heart is enlarged but stable.  There is a large stable hiatal hernia.  There is a fairly fulminant pattern of pulmonary edema and probable small bilateral pleural effusions.  IMPRESSION: Congestive heart failure.   Original Report Authenticated By: Rudie Meyer,  M.D.    ECG  Normal sinus rhythm, rates in the 60s  ASSESSMENT AND PLAN  Presyncope is secondary to low blood pressure with lack of appropriate chronotropic response with standing. She is on low-dose carvedilol but obviously is very sensitive to it. She also has a history of aortic stenosis and this may be contributing as well.  Echocardiogram is pending.  She is very fragile and labile. I would discontinue her carvedilol to achieve a better heart rate response with change in position. She will also need to be sent home on her diuretics. Hopefully stopping the carvedilol will help keep her blood pressure stable. Her greatest risk is falling. She's fallen 3 times in the past. She is in a point with her cardiologist Dr. Tiburcio Pea next week. I will send her home with a copy of her discharge summary as well as a copy of the echocardiogram and EKGs.     Signed, Valera Castle, MD 01/11/2013, 1:15 PM

## 2013-01-11 NOTE — Progress Notes (Signed)
*  PRELIMINARY RESULTS* Echocardiogram 2D Echocardiogram has been performed.  Conrad [place] 01/11/2013, 10:10 AM

## 2013-01-11 NOTE — Progress Notes (Signed)
Subjective: She was admitted yesterday with worsening CHF and presyncope. She says she feels pretty well now. She has been having low blood pressures.  Objective: Vital signs in last 24 hours: Temp:  [97.9 F (36.6 C)-98.2 F (36.8 C)] 97.9 F (36.6 C) (08/15 0500) Pulse Rate:  [58-66] 65 (08/15 0500) Resp:  [16-24] 20 (08/15 0500) BP: (101-150)/(45-74) 132/65 mmHg (08/15 0510) SpO2:  [92 %-100 %] 100 % (08/15 0510) Weight:  [62.959 kg (138 lb 12.8 oz)-63.504 kg (140 lb)] 62.959 kg (138 lb 12.8 oz) (08/14 1649) Weight change:  Last BM Date: 01/10/13  Intake/Output from previous day: 08/14 0701 - 08/15 0700 In: 480 [P.O.:480] Out: 600 [Urine:600]  PHYSICAL EXAM General appearance: alert, cooperative and mild distress Resp: rhonchi But no crackles Cardio: She has a systolic murmur GI: soft, non-tender; bowel sounds normal; no masses,  no organomegaly Extremities: edema Trace of her ankles  Lab Results:    Basic Metabolic Panel:  Recent Labs  16/10/96 1314 01/11/13 0618  NA 140 142  K 4.2 3.8  CL 99 101  CO2 34* 35*  GLUCOSE 74 84  BUN 25* 23  CREATININE 1.28* 1.25*  CALCIUM 9.0 8.9   Liver Function Tests:  Recent Labs  01/10/13 1314  AST 13  ALT 5  ALKPHOS 87  BILITOT 0.3  PROT 7.0  ALBUMIN 3.6   No results found for this basename: LIPASE, AMYLASE,  in the last 72 hours No results found for this basename: AMMONIA,  in the last 72 hours CBC:  Recent Labs  01/10/13 1314 01/11/13 0618  WBC 4.8 3.7*  NEUTROABS 2.8  --   HGB 10.0* 9.5*  HCT 31.6* 30.2*  MCV 92.9 92.4  PLT 255 238   Cardiac Enzymes:  Recent Labs  01/10/13 1817 01/10/13 2344 01/11/13 0618  TROPONINI <0.30 <0.30 <0.30   BNP: No results found for this basename: PROBNP,  in the last 72 hours D-Dimer: No results found for this basename: DDIMER,  in the last 72 hours CBG: No results found for this basename: GLUCAP,  in the last 72 hours Hemoglobin A1C: No results found for  this basename: HGBA1C,  in the last 72 hours Fasting Lipid Panel: No results found for this basename: CHOL, HDL, LDLCALC, TRIG, CHOLHDL, LDLDIRECT,  in the last 72 hours Thyroid Function Tests: No results found for this basename: TSH, T4TOTAL, FREET4, T3FREE, THYROIDAB,  in the last 72 hours Anemia Panel: No results found for this basename: VITAMINB12, FOLATE, FERRITIN, TIBC, IRON, RETICCTPCT,  in the last 72 hours Coagulation: No results found for this basename: LABPROT, INR,  in the last 72 hours Urine Drug Screen: Drugs of Abuse  No results found for this basename: labopia, cocainscrnur, labbenz, amphetmu, thcu, labbarb    Alcohol Level: No results found for this basename: ETH,  in the last 72 hours Urinalysis:  Recent Labs  01/10/13 1444  COLORURINE YELLOW  LABSPEC 1.015  PHURINE 6.0  GLUCOSEU NEGATIVE  HGBUR NEGATIVE  BILIRUBINUR NEGATIVE  KETONESUR NEGATIVE  PROTEINUR NEGATIVE  UROBILINOGEN 0.2  NITRITE NEGATIVE  LEUKOCYTESUR NEGATIVE   Misc. Labs:  ABGS No results found for this basename: PHART, PCO2, PO2ART, TCO2, HCO3,  in the last 72 hours CULTURES No results found for this or any previous visit (from the past 240 hour(s)). Studies/Results: Dg Chest Port 1 View  01/10/2013   *RADIOLOGY REPORT*  Clinical Data: Chest pain, shortness of breath and congestion.  PORTABLE CHEST - 1 VIEW  Comparison: Chest x-ray 10/31/2012.  Findings: The heart is enlarged but stable.  There is a large stable hiatal hernia.  There is a fairly fulminant pattern of pulmonary edema and probable small bilateral pleural effusions.  IMPRESSION: Congestive heart failure.   Original Report Authenticated By: Rudie Meyer, M.D.    Medications:  Prior to Admission:  Prescriptions prior to admission  Medication Sig Dispense Refill  . acetaminophen (TYLENOL) 325 MG tablet Take 2 tablets (650 mg total) by mouth every 6 (six) hours as needed.      . carvedilol (COREG) 3.125 MG tablet Take 3.125 mg  by mouth 2 (two) times daily with a meal.      . clonazePAM (KLONOPIN) 0.5 MG tablet Take 0.25 mg by mouth See admin instructions. Take 1 tablet by mouth once daily.  May take 1 tablet by mouth as needed for anxiety.      Marland Kitchen escitalopram (LEXAPRO) 20 MG tablet Take 20 mg by mouth daily.      . ferrous sulfate 325 (65 FE) MG tablet Take 325 mg by mouth daily with breakfast.      . gabapentin (NEURONTIN) 100 MG capsule Take 100 mg by mouth at bedtime.      Marland Kitchen HYDROcodone-acetaminophen (NORCO) 10-325 MG per tablet Take 1 tablet by mouth 2 (two) times daily.      . mirtazapine (REMERON) 15 MG tablet Take 15 mg by mouth at bedtime.      . pantoprazole (PROTONIX) 40 MG tablet Take 40 mg by mouth daily.      . polyethylene glycol (MIRALAX / GLYCOLAX) packet Take 17 g by mouth daily.      . potassium chloride SA (K-DUR,KLOR-CON) 20 MEQ tablet Take 20 mEq by mouth 2 (two) times daily.      . simethicone (MYLICON) 80 MG chewable tablet Chew 2 tablets (160 mg total) by mouth 3 (three) times daily.  30 tablet  0  . tiZANidine (ZANAFLEX) 2 MG tablet Take 2 mg by mouth 3 (three) times daily.      Marland Kitchen torsemide (DEMADEX) 100 MG tablet Take 100 mg by mouth daily.       . Travoprost, BAK Free, (TRAVATAN) 0.004 % SOLN ophthalmic solution Place 1 drop into both eyes at bedtime.      . traZODone (DESYREL) 50 MG tablet Take 50 mg by mouth at bedtime.      . feeding supplement (ENSURE COMPLETE) LIQD Take 237 mLs by mouth 2 (two) times daily between meals.       Scheduled: . aspirin EC  325 mg Oral Daily  . carvedilol  3.125 mg Oral BID WC  . clonazePAM  0.25 mg Oral QHS  . enoxaparin (LOVENOX) injection  30 mg Subcutaneous Q24H  . escitalopram  20 mg Oral Daily  . feeding supplement  237 mL Oral BID BM  . ferrous sulfate  325 mg Oral Q breakfast  . furosemide  40 mg Intravenous Daily  . gabapentin  100 mg Oral QHS  . HYDROcodone-acetaminophen  1 tablet Oral BID  . mirtazapine  15 mg Oral QHS  . pantoprazole  40  mg Oral Daily  . polyethylene glycol  17 g Oral Daily  . potassium chloride SA  20 mEq Oral BID  . simethicone  160 mg Oral TID  . sodium chloride  3 mL Intravenous Q12H  . sodium chloride  3 mL Intravenous Q12H  . tiZANidine  2 mg Oral TID  . Travoprost (BAK Free)  1 drop Both Eyes QHS  .  traZODone  50 mg Oral QHS   Continuous:  ZOX:WRUEAV chloride, acetaminophen, acetaminophen, alum & mag hydroxide-simeth, clonazePAM, magnesium hydroxide, morphine injection, ondansetron (ZOFRAN) IV, ondansetron, sodium chloride  Assesment: She was admitted with syncope. She does have aortic valve stenosis. She has coronary artery occlusive disease and congestive heart failure. She says she feels a little bit better. She has had severe problems with a very large hiatal hernia and had surgery on that about 2 months ago Principal Problem:   Syncope Active Problems:   Stenosis of aortic valve   CAD (coronary artery disease)   GERD (gastroesophageal reflux disease)   HTN (hypertension)   Anxiety and depression   Iron deficiency anemia secondary to blood loss (chronic)   CHF (congestive heart failure)   Acute renal failure   Pain of left arm    Plan: She is negative so far for myocardial infarction. I will ask for cardiology consult although [facility] is    LOS: 1 day   Lux Skilton L 01/11/2013, 8:28 AM

## 2013-01-11 NOTE — Evaluation (Signed)
Physical Therapy Evaluation Patient Details Name: [name] MRN: [id number] DOB: 09-23-1919 Today's Date: 01/11/2013 Time: 4098-1191 PT Time Calculation (min): 32 min  PT Assessment / Plan / Recommendation History of Present Illness   Pt is admitted with CHF.  She is a resident of [facility] and needs only minimal assistance there.    Clinical Impression  Pt was seen for evaluation and found to be at prior functional level.  No further PT should be needed.    PT Assessment  Patent does not need any further PT services    Follow Up Recommendations  No PT follow up    Does the patient have the potential to tolerate intense rehabilitation    no  Barriers to Discharge        Equipment Recommendations  None recommended by PT    Recommendations for Other Services     Frequency      Precautions / Restrictions Precautions Precautions: Fall Restrictions Weight Bearing Restrictions: No   Pertinent Vitals/Pain       Mobility  Bed Mobility Bed Mobility: Supine to Sit;Sit to Supine Supine to Sit: 5: Supervision;HOB flat Sit to Supine: 4: Min assist;HOB flat Details for Bed Mobility Assistance: some difficulty noted with lifting LEs into the bed Transfers Transfers: Sit to Stand;Stand to Sit Sit to Stand: 6: Modified independent (Device/Increase time);From bed Stand to Sit: 6: Modified independent (Device/Increase time);To bed Ambulation/Gait Ambulation/Gait Assistance: 6: Modified independent (Device/Increase time) Ambulation Distance (Feet): 125 Feet Assistive device: Rolling walker Gait Pattern: Trunk flexed;Within Functional Limits Gait velocity: fairly rapid pace Stairs: No Wheelchair Mobility Wheelchair Mobility: No    Exercises     PT Diagnosis:    PT Problem List:   PT Treatment Interventions:       PT Goals(Current goals can be found in the care plan section) Acute Rehab PT Goals PT Goal Formulation: No goals set, d/c therapy  Visit Information  Last  PT Received On: 01/11/13       Prior Functioning  Home Living Family/patient expects to be discharged to:: Assisted living Prior Function Level of Independence: Needs assistance Gait / Transfers Assistance Needed: independent with gait, needs SBA to minimal assist with transfers ADL's / Homemaking Assistance Needed: needs assistance with bathing, total assist with housekeeping Communication Communication: No difficulties    Cognition  Cognition Arousal/Alertness: Awake/alert Behavior During Therapy: WFL for tasks assessed/performed Overall Cognitive Status: Within Functional Limits for tasks assessed    Extremity/Trunk Assessment Lower Extremity Assessment Lower Extremity Assessment: Overall WFL for tasks assessed   Balance Balance Balance Assessed: No  End of Session PT - End of Session Equipment Utilized During Treatment: Gait belt Activity Tolerance: Patient tolerated treatment well Patient left: in bed;with call bell/phone within reach;with bed alarm set  GP     Konrad Penta 01/11/2013, 4:23 PM

## 2013-01-11 NOTE — Clinical Social Work Psychosocial (Signed)
Clinical Social Work Department BRIEF PSYCHOSOCIAL ASSESSMENT 01/11/2013  Patient:  [name], [name]     Account Number:  [id number]     Admit date:  01/10/2013  Clinical Social Worker:  Nancie Neas  Date/Time:  01/11/2013 09:10 AM  Referred by:  CSW  Date Referred:  01/11/2013 Referred for  ALF Placement   Other Referral:   Interview type:  Patient Other interview type:    PSYCHOSOCIAL DATA Living Status:  FACILITY Admitted from facility:  [facility] Level of care:  Assisted Living Primary support name:  Susie Primary support relationship to patient:  FAMILY Degree of support available:   supportive per pt    CURRENT CONCERNS Current Concerns  Post-Acute Placement   Other Concerns:    SOCIAL WORK ASSESSMENT / PLAN CSW met with pt at bedside. Pt alert and oriented and reports she has been a resident at [facility] for just over a week. She was at [facility] prior to that for rehab. Pt's best support is a niece, Susie. Pt indicates Susie lives locally and visits her regularly. Per Wall, pt is a very limited assist, generally just with bathing. She uses a rolling walker for ambulating. Awaiting cardiology consult per MD note. Whitney indicates okay for return on weekend if ready.   Assessment/plan status:  Psychosocial Support/Ongoing Assessment of Needs Other assessment/ plan:   Information/referral to community resources:   [employer] [facility]    PATIENT'S/FAMILY'S RESPONSE TO PLAN OF CARE: Pt states she enjoys being at [employer] [facility] and plans to return there at d/c. CSW will continue to follow.       Derenda Fennel, [place] [phone]

## 2013-01-11 NOTE — Progress Notes (Signed)
UR Chart Review Completed  

## 2013-01-12 LAB — BASIC METABOLIC PANEL
BUN: 26 mg/dL — ABNORMAL HIGH (ref 6–23)
Calcium: 9 mg/dL (ref 8.4–10.5)
Chloride: 99 mEq/L (ref 96–112)
Creatinine, Ser: 1.28 mg/dL — ABNORMAL HIGH (ref 0.50–1.10)
GFR calc Af Amer: 41 mL/min — ABNORMAL LOW (ref >90)

## 2013-01-12 NOTE — Progress Notes (Signed)
Subjective: She feels better. She has no new complaints. Her breathing is okay.  Objective: Vital signs in last 24 hours: Temp:  [97.5 F (36.4 C)-98.1 F (36.7 C)] 97.5 F (36.4 C) (08/16 0612) Pulse Rate:  [61-64] 61 (08/16 0612) Resp:  [18-20] 20 (08/16 0612) BP: (92-153)/(55-79) 153/79 mmHg (08/16 0612) SpO2:  [93 %-95 %] 94 % (08/16 0612) Weight:  [64.365 kg (141 lb 14.4 oz)] 64.365 kg (141 lb 14.4 oz) (08/16 0612) Weight change: 0.862 kg (1 lb 14.4 oz) Last BM Date: 01/11/13  Intake/Output from previous day: 08/15 0701 - 08/16 0700 In: 720 [P.O.:720] Out: 1126 [Urine:1125; Stool:1]  PHYSICAL EXAM General appearance: alert, cooperative and no distress Resp: clear to auscultation bilaterally Cardio: She has a loud systolic murmur GI: soft, non-tender; bowel sounds normal; no masses,  no organomegaly Extremities: extremities normal, atraumatic, no cyanosis or edema  Lab Results:    Basic Metabolic Panel:  Recent Labs  09/60/45 0618 01/12/13 0606  NA 142 138  K 3.8 3.8  CL 101 99  CO2 35* 34*  GLUCOSE 84 82  BUN 23 26*  CREATININE 1.25* 1.28*  CALCIUM 8.9 9.0   Liver Function Tests:  Recent Labs  01/10/13 1314  AST 13  ALT 5  ALKPHOS 87  BILITOT 0.3  PROT 7.0  ALBUMIN 3.6   No results found for this basename: LIPASE, AMYLASE,  in the last 72 hours No results found for this basename: AMMONIA,  in the last 72 hours CBC:  Recent Labs  01/10/13 1314 01/11/13 0618  WBC 4.8 3.7*  NEUTROABS 2.8  --   HGB 10.0* 9.5*  HCT 31.6* 30.2*  MCV 92.9 92.4  PLT 255 238   Cardiac Enzymes:  Recent Labs  01/10/13 1817 01/10/13 2344 01/11/13 0618  TROPONINI <0.30 <0.30 <0.30   BNP: No results found for this basename: PROBNP,  in the last 72 hours D-Dimer: No results found for this basename: DDIMER,  in the last 72 hours CBG: No results found for this basename: GLUCAP,  in the last 72 hours Hemoglobin A1C: No results found for this basename:  HGBA1C,  in the last 72 hours Fasting Lipid Panel: No results found for this basename: CHOL, HDL, LDLCALC, TRIG, CHOLHDL, LDLDIRECT,  in the last 72 hours Thyroid Function Tests: No results found for this basename: TSH, T4TOTAL, FREET4, T3FREE, THYROIDAB,  in the last 72 hours Anemia Panel: No results found for this basename: VITAMINB12, FOLATE, FERRITIN, TIBC, IRON, RETICCTPCT,  in the last 72 hours Coagulation: No results found for this basename: LABPROT, INR,  in the last 72 hours Urine Drug Screen: Drugs of Abuse  No results found for this basename: labopia, cocainscrnur, labbenz, amphetmu, thcu, labbarb    Alcohol Level: No results found for this basename: ETH,  in the last 72 hours Urinalysis:  Recent Labs  01/10/13 1444  COLORURINE YELLOW  LABSPEC 1.015  PHURINE 6.0  GLUCOSEU NEGATIVE  HGBUR NEGATIVE  BILIRUBINUR NEGATIVE  KETONESUR NEGATIVE  PROTEINUR NEGATIVE  UROBILINOGEN 0.2  NITRITE NEGATIVE  LEUKOCYTESUR NEGATIVE   Misc. Labs:  ABGS No results found for this basename: PHART, PCO2, PO2ART, TCO2, HCO3,  in the last 72 hours CULTURES No results found for this or any previous visit (from the past 240 hour(s)). Studies/Results: Dg Chest Port 1 View  01/10/2013   *RADIOLOGY REPORT*  Clinical Data: Chest pain, shortness of breath and congestion.  PORTABLE CHEST - 1 VIEW  Comparison: Chest x-ray 10/31/2012.  Findings: The heart is  enlarged but stable.  There is a large stable hiatal hernia.  There is a fairly fulminant pattern of pulmonary edema and probable small bilateral pleural effusions.  IMPRESSION: Congestive heart failure.   Original Report Authenticated By: Rudie Meyer, M.D.    Medications:  Scheduled: . aspirin EC  325 mg Oral Daily  . clonazePAM  0.25 mg Oral QHS  . enoxaparin (LOVENOX) injection  30 mg Subcutaneous Q24H  . escitalopram  20 mg Oral Daily  . feeding supplement  237 mL Oral BID BM  . ferrous sulfate  325 mg Oral Q breakfast  .  furosemide  40 mg Intravenous Daily  . gabapentin  100 mg Oral QHS  . HYDROcodone-acetaminophen  1 tablet Oral BID  . mirtazapine  15 mg Oral QHS  . pantoprazole  40 mg Oral Daily  . polyethylene glycol  17 g Oral Daily  . potassium chloride SA  20 mEq Oral BID  . simethicone  160 mg Oral TID  . sodium chloride  3 mL Intravenous Q12H  . sodium chloride  3 mL Intravenous Q12H  . tiZANidine  2 mg Oral TID  . Travoprost (BAK Free)  1 drop Both Eyes QHS  . traZODone  50 mg Oral QHS   Continuous:  ZOX:WRUEAV chloride, acetaminophen, acetaminophen, alum & mag hydroxide-simeth, clonazePAM, magnesium hydroxide, morphine injection, ondansetron (ZOFRAN) IV, ondansetron, sodium chloride  Assesment: She had near syncope probably from hypotension. She has aortic stenosis. She has CHF and generally looks better Principal Problem:   Syncope Active Problems:   Stenosis of aortic valve   CAD (coronary artery disease)   GERD (gastroesophageal reflux disease)   HTN (hypertension)   Anxiety and depression   Iron deficiency anemia secondary to blood loss (chronic)   CHF (congestive heart failure)   Acute renal failure   Pain of left arm    Plan: Continue current medications but I think she can be discharged. I will discharge her off carvedilol    LOS: 2 days   Carneshia Raker L 01/12/2013, 10:18 AM

## 2013-01-12 NOTE — Progress Notes (Signed)
[employer] staff here to transport pt to facility.

## 2013-01-12 NOTE — Progress Notes (Signed)
FL2 sent from SW at [facility].  Pt stable, alert, and oriented.  Discussed d/c instructions with pt who verbalizes understanding.  [facility] aware that patient is ready to be transported back to facility.  IV removed.

## 2013-01-12 NOTE — Discharge Summary (Signed)
Physician Discharge Summary  Patient ID: [name] MRN: [id number] DOB/AGE: 06/14/1919 79 y.o. Primary Care Physician:Tarhonda Hollenberg L, MD Admit date: 01/10/2013 Discharge date: 01/12/2013    Discharge Diagnoses:   Principal Problem:   Syncope Active Problems:   Stenosis of aortic valve   CAD (coronary artery disease)   GERD (gastroesophageal reflux disease)   HTN (hypertension)   Anxiety and depression   Iron deficiency anemia secondary to blood loss (chronic)   CHF (congestive heart failure)   Acute renal failure   Pain of left arm     Medication List    STOP taking these medications       carvedilol 3.125 MG tablet  Commonly known as:  COREG      TAKE these medications       acetaminophen 325 MG tablet  Commonly known as:  TYLENOL  Take 2 tablets (650 mg total) by mouth every 6 (six) hours as needed.     clonazePAM 0.5 MG tablet  Commonly known as:  KLONOPIN  Take 0.25 mg by mouth See admin instructions. Take 1 tablet by mouth once daily.  May take 1 tablet by mouth as needed for anxiety.     escitalopram 20 MG tablet  Commonly known as:  LEXAPRO  Take 20 mg by mouth daily.     feeding supplement Liqd  Take 237 mLs by mouth 2 (two) times daily between meals.     ferrous sulfate 325 (65 FE) MG tablet  Take 325 mg by mouth daily with breakfast.     gabapentin 100 MG capsule  Commonly known as:  NEURONTIN  Take 100 mg by mouth at bedtime.     HYDROcodone-acetaminophen 10-325 MG per tablet  Commonly known as:  NORCO  Take 1 tablet by mouth 2 (two) times daily.     mirtazapine 15 MG tablet  Commonly known as:  REMERON  Take 15 mg by mouth at bedtime.     pantoprazole 40 MG tablet  Commonly known as:  PROTONIX  Take 40 mg by mouth daily.     polyethylene glycol packet  Commonly known as:  MIRALAX / GLYCOLAX  Take 17 g by mouth daily.     potassium chloride SA 20 MEQ tablet  Commonly known as:  K-DUR,KLOR-CON  Take 20 mEq by mouth 2 (two)  times daily.     simethicone 80 MG chewable tablet  Commonly known as:  MYLICON  Chew 2 tablets (160 mg total) by mouth 3 (three) times daily.     tiZANidine 2 MG tablet  Commonly known as:  ZANAFLEX  Take 2 mg by mouth 3 (three) times daily.     torsemide 100 MG tablet  Commonly known as:  DEMADEX  Take 100 mg by mouth daily.     Travoprost (BAK Free) 0.004 % Soln ophthalmic solution  Commonly known as:  TRAVATAN  Place 1 drop into both eyes at bedtime.     traZODone 50 MG tablet  Commonly known as:  DESYREL  Take 50 mg by mouth at bedtime.        Discharged Condition: Improved    Consults: Cardiology  Significant Diagnostic Studies: Dg Chest Port 1 View  01/10/2013   *RADIOLOGY REPORT*  Clinical Data: Chest pain, shortness of breath and congestion.  PORTABLE CHEST - 1 VIEW  Comparison: Chest x-ray 10/31/2012.  Findings: The heart is enlarged but stable.  There is a large stable hiatal hernia.  There is a fairly fulminant pattern of pulmonary  edema and probable small bilateral pleural effusions.  IMPRESSION: Congestive heart failure.   Original Report Authenticated By: Rudie Meyer, M.D.    Lab Results: Basic Metabolic Panel:  Recent Labs  16/10/96 0618 01/12/13 0606  NA 142 138  K 3.8 3.8  CL 101 99  CO2 35* 34*  GLUCOSE 84 82  BUN 23 26*  CREATININE 1.25* 1.28*  CALCIUM 8.9 9.0   Liver Function Tests:  Recent Labs  01/10/13 1314  AST 13  ALT 5  ALKPHOS 87  BILITOT 0.3  PROT 7.0  ALBUMIN 3.6     CBC:  Recent Labs  01/10/13 1314 01/11/13 0618  WBC 4.8 3.7*  NEUTROABS 2.8  --   HGB 10.0* 9.5*  HCT 31.6* 30.2*  MCV 92.9 92.4  PLT 255 238    No results found for this or any previous visit (from the past 240 hour(s)).   Hospital Course: She was admitted with near syncope. She was found to have heart failure on her chest x-ray. She has aortic stenosis as well. She was treated with Lasix and improved. She appear to be very sensitive to  carvedilol and this was discontinued. She had cardiology consultation and the recommendation was to discontinue carvedilol and followup with her regular cardiologist. She had PT consultation it was not felt that she needed inpatient physical therapy. By the time of discharge she was much improved awake alert able to ambulate with no new symptoms. She is a life flight to sleep  Discharge Exam: Blood pressure 153/79, pulse 61, temperature 97.5 F (36.4 C), temperature source Oral, resp. rate 20, height 5\' 2"  (1.575 m), weight 64.365 kg (141 lb 14.4 oz), SpO2 94.00%. She is awake and alert. Her chest is clear. Her heart is regular. Abdomen is soft.  Disposition: She's going to go back to her assisted living facility. Physical therapy services are available there. She has a followup appointment with her regular cardiologist next week. She will follow in my office in about 2 weeks.      Discharge Orders   Future Orders Complete By Expires   Discharge patient  As directed         Signed: Jaxxson Cavanah L Pager [phone]  01/12/2013, 10:22 AM

## 2013-02-27 ENCOUNTER — Encounter (INDEPENDENT_AMBULATORY_CARE_PROVIDER_SITE_OTHER): Payer: Self-pay | Admitting: *Deleted

## 2013-04-01 ENCOUNTER — Ambulatory Visit (INDEPENDENT_AMBULATORY_CARE_PROVIDER_SITE_OTHER): Payer: Medicare Other | Admitting: Internal Medicine

## 2013-06-17 ENCOUNTER — Emergency Department (HOSPITAL_COMMUNITY)
Admission: EM | Admit: 2013-06-17 | Discharge: 2013-06-17 | Disposition: A | Discharge status: Home / Self Care | Payer: Medicare Other | Attending: Emergency Medicine | Admitting: Emergency Medicine

## 2013-06-17 ENCOUNTER — Encounter (HOSPITAL_COMMUNITY): Payer: Self-pay | Admitting: Emergency Medicine

## 2013-06-17 ENCOUNTER — Ambulatory Visit (INDEPENDENT_AMBULATORY_CARE_PROVIDER_SITE_OTHER): Payer: Medicare Other | Admitting: Internal Medicine

## 2013-06-17 DIAGNOSIS — I2510 Atherosclerotic heart disease of native coronary artery without angina pectoris: Secondary | ICD-10-CM | POA: Insufficient documentation

## 2013-06-17 DIAGNOSIS — Z79899 Other long term (current) drug therapy: Secondary | ICD-10-CM | POA: Insufficient documentation

## 2013-06-17 DIAGNOSIS — S81009A Unspecified open wound, unspecified knee, initial encounter: Secondary | ICD-10-CM | POA: Insufficient documentation

## 2013-06-17 DIAGNOSIS — Y9210 Unspecified residential institution as the place of occurrence of the external cause: Secondary | ICD-10-CM | POA: Diagnosis not present

## 2013-06-17 DIAGNOSIS — Z8711 Personal history of peptic ulcer disease: Secondary | ICD-10-CM | POA: Insufficient documentation

## 2013-06-17 DIAGNOSIS — F329 Major depressive disorder, single episode, unspecified: Secondary | ICD-10-CM | POA: Insufficient documentation

## 2013-06-17 DIAGNOSIS — F411 Generalized anxiety disorder: Secondary | ICD-10-CM | POA: Diagnosis not present

## 2013-06-17 DIAGNOSIS — R51 Headache: Secondary | ICD-10-CM | POA: Insufficient documentation

## 2013-06-17 DIAGNOSIS — I252 Old myocardial infarction: Secondary | ICD-10-CM | POA: Insufficient documentation

## 2013-06-17 DIAGNOSIS — I10 Essential (primary) hypertension: Secondary | ICD-10-CM | POA: Insufficient documentation

## 2013-06-17 DIAGNOSIS — S91009A Unspecified open wound, unspecified ankle, initial encounter: Secondary | ICD-10-CM | POA: Diagnosis present

## 2013-06-17 DIAGNOSIS — I509 Heart failure, unspecified: Secondary | ICD-10-CM | POA: Insufficient documentation

## 2013-06-17 DIAGNOSIS — K219 Gastro-esophageal reflux disease without esophagitis: Secondary | ICD-10-CM | POA: Insufficient documentation

## 2013-06-17 DIAGNOSIS — W010XXA Fall on same level from slipping, tripping and stumbling without subsequent striking against object, initial encounter: Secondary | ICD-10-CM | POA: Insufficient documentation

## 2013-06-17 DIAGNOSIS — Y9389 Activity, other specified: Secondary | ICD-10-CM | POA: Diagnosis not present

## 2013-06-17 DIAGNOSIS — S81809A Unspecified open wound, unspecified lower leg, initial encounter: Principal | ICD-10-CM

## 2013-06-17 DIAGNOSIS — G8929 Other chronic pain: Secondary | ICD-10-CM | POA: Insufficient documentation

## 2013-06-17 DIAGNOSIS — F3289 Other specified depressive episodes: Secondary | ICD-10-CM | POA: Diagnosis not present

## 2013-06-17 DIAGNOSIS — W19XXXA Unspecified fall, initial encounter: Secondary | ICD-10-CM

## 2013-06-17 DIAGNOSIS — S81812A Laceration without foreign body, left lower leg, initial encounter: Secondary | ICD-10-CM

## 2013-06-17 NOTE — ED Notes (Signed)
Pt from [facility]; got up to the bathroom and fell. Denies hitting head or LOC. Skin tear to side of left knee.

## 2013-06-17 NOTE — Discharge Instructions (Signed)
Skin Tear Care  A skin tear is a wound in which the top layer of skin has peeled off. This is a common problem with aging because the skin becomes thinner and more fragile as a person gets older. In addition, some medicines, such as oral corticosteroids, can lead to skin thinning if taken for long periods of time.   A skin tear is often repaired with tape or skin adhesive strips. This keeps the skin that has been peeled off in contact with the healthier skin beneath. Depending on the location of the wound, a bandage (dressing) may be applied over the tape or skin adhesive strips. Sometimes, during the healing process, the skin turns black and dies. Even when this happens, the torn skin acts as a good dressing until the skin underneath gets healthier and repairs itself.  HOME CARE INSTRUCTIONS   · Change dressings once per day or as directed by your caregiver.  · Gently clean the skin tear and the area around the tear using saline solution or mild soap and water.  · Do not rub the injured skin dry. Let the area air dry.  · Apply petroleum jelly or an antibiotic cream or ointment to keep the tear moist. This will help the wound heal. Do not allow a scab to form.  · If the dressing sticks before the next dressing change, moisten it with warm soapy water and gently remove it.  · Protect the injured skin until it has healed.  · Only take over-the-counter or prescription medicines as directed by your caregiver.  · Take showers or baths using warm soapy water. Apply a new dressing after the shower or bath.  · Keep all follow-up appointments as directed by your caregiver.    SEEK IMMEDIATE MEDICAL CARE IF:   · You have redness, swelling, or increasing pain in the skin tear.  · You have pus coming from the skin tear.  · You have chills.  · You have a red streak that goes away from the skin tear.  · You have a bad smell coming from the tear or dressing.  · You have a fever or persistent symptoms for more than 2 3 days.  · You  have a fever and your symptoms suddenly get worse.  MAKE SURE YOU:  · Understand these instructions.  · Will watch this condition.  · Will get help right away if your child is not doing well or gets worse.  Document Released: 02/08/2001 Document Revised: 02/08/2012 Document Reviewed: 11/28/2011  [employer]® Patient Information ©2014 [employer].

## 2013-06-18 ENCOUNTER — Encounter (INDEPENDENT_AMBULATORY_CARE_PROVIDER_SITE_OTHER): Payer: Self-pay | Admitting: Internal Medicine

## 2013-06-18 ENCOUNTER — Ambulatory Visit (INDEPENDENT_AMBULATORY_CARE_PROVIDER_SITE_OTHER): Payer: Medicare Other | Admitting: Internal Medicine

## 2013-06-18 VITALS — BP 80/58 | HR 60 | Temp 97.9°F | Ht 63.0 in | Wt 140.0 lb

## 2013-06-18 DIAGNOSIS — K219 Gastro-esophageal reflux disease without esophagitis: Secondary | ICD-10-CM

## 2013-06-18 MED ORDER — PANTOPRAZOLE SODIUM 40 MG PO TBEC: 40.0000 mg | DELAYED_RELEASE_TABLET | Freq: Every day | ORAL | Status: AC

## 2013-06-18 NOTE — Patient Instructions (Signed)
Continue the Protonix. HOB 45 degrees. Feel patient her meals.

## 2013-06-18 NOTE — Progress Notes (Signed)
Subjective:     Patient ID: Simeon CraftEsther B Gores, female   DOB: 09/10/1919, 80 y.o.   MRN: [id number]  HPI 80 yr old female resident [facility] [address] [facility]. Care giver states they are here for refill on PPI. Caregiver says patient is not doing well. Patient has been placed in Hospice due to her heart condition. Hx of CHF. Her appetite is not good.  Her hand shake and she has a hard time eating.  No abdominal pain. She has a BM daily. No melena or bright red rectal bleeding.    EGD 08/12/2012 Indications: P vomiting and coffee-ground emesis. She has history of large heart hernia and was [facility] and responded to NG suction.  Impression:  Large hiatal hernia with organoaxial rotation and relative obstruction at the of hiatus. Most of the stomach is intrathoracic.  Mild changes of reflux esophagitis limited to GE junction but no evidence of pyloric stenosis or pyloric channel ulcer.  Recommendatios: Surgical consultation for laparoscopic partial or complete reduction of hernia with gastropexy which may prevent organo-axial rotation and obstruction.  Ms. Renaldo Fiddlerdkins patient's niece and power of attorney would like for her to be [facility].  She had a G tube placed according to Caregiver. Removed about 3 months later.       Review of Systems  See hpi Current Outpatient Prescriptions  Medication Sig Dispense Refill  . acetaminophen (TYLENOL) 325 MG tablet Take 2 tablets (650 mg total) by mouth every 6 (six) hours as needed.      . benzonatate (TESSALON) 100 MG capsule Take 200 mg by mouth 3 (three) times daily.      . chlorhexidine (PERIDEX) 0.12 % solution Use as directed 15 mLs in the mouth or throat 3 (three) times daily.      . clonazePAM (KLONOPIN) 0.5 MG tablet Take 0.25 mg by mouth See admin instructions. Take 1 tablet by mouth once daily as needed for anxiety.      Marland Kitchen. escitalopram (LEXAPRO) 20 MG tablet Take 20 mg by mouth daily.      . ferrous  sulfate 325 (65 FE) MG tablet Take 325 mg by mouth daily with breakfast.      . gabapentin (NEURONTIN) 100 MG capsule Take 100 mg by mouth at bedtime.      Marland Kitchen. guaiFENesin-dextromethorphan (ROBITUSSIN DM) 100-10 MG/5ML syrup Take 5 mLs by mouth every 4 (four) hours as needed for cough.      Marland Kitchen. HYDROcodone-acetaminophen (NORCO) 10-325 MG per tablet Take 1 tablet by mouth daily as needed (for pain).       . mirtazapine (REMERON) 15 MG tablet Take 15 mg by mouth at bedtime.      . pantoprazole (PROTONIX) 40 MG tablet Take 40 mg by mouth daily.      . polyethylene glycol (MIRALAX / GLYCOLAX) packet Take 17 g by mouth daily.      . potassium chloride SA (K-DUR,KLOR-CON) 20 MEQ tablet Take 20 mEq by mouth 2 (two) times daily.      Marland Kitchen. tiZANidine (ZANAFLEX) 2 MG tablet Take 2 mg by mouth 3 (three) times daily.      Marland Kitchen. torsemide (DEMADEX) 100 MG tablet Take 100 mg by mouth daily.       . Travoprost, BAK Free, (TRAVATAN) 0.004 % SOLN ophthalmic solution Place 1 drop into both eyes at bedtime.      . traZODone (DESYREL) 50 MG tablet Take 50 mg by mouth at bedtime.      .Marland Kitchen  feeding supplement (ENSURE COMPLETE) LIQD Take 237 mLs by mouth 2 (two) times daily between meals.       No current facility-administered medications for this visit.   Past Medical History  Diagnosis Date  . Coronary artery disease   . Hypercholesterolemia   . Hypertension   . Mitral insufficiency   . Aortic stenosis   . Hiatal hernia   . GERD (gastroesophageal reflux disease)   . History of stomach ulcers   . Chronic headaches   . Anxiety and depression   . MI (myocardial infarction) 2005  . CHF (congestive heart failure)    Past Surgical History  Procedure Laterality Date  . Abdominal hysterectomy      in her 30's  . Hip fracture surgery    . Shoulder surgery Right   . Esophagogastroduodenoscopy N/A 08/12/2012    Procedure: ESOPHAGOGASTRODUODENOSCOPY (EGD);  Surgeon: Malissa Hippo, MD;  Location: [facility];  Service:  Endoscopy;  Laterality: N/A;         Objective:   Physical Exam There were no vitals filed for this visit.  Filed Vitals:   06/18/13 1014  BP: 80/58  Pulse: 60  Temp: 97.9 F (36.6 C)  Height: 5\' 3"  (1.6 m)  Weight: 140 lb (63.504 kg)  Alert and oriented. Skin warm and dry. Oral mucosa is moist.   . Sclera anicteric, conjunctivae is pink. Thyroid not enlarged. No cervical lymphadenopathy. Lungs clear. Heart regular rate and rhythm. Loud systolic murmur heard.  Abdomen is soft. Bowel sounds are positive.  No abdominal masses felt. No tenderness.  No edema to lower extremities. Patient is alert and oriented. Dressing in place to rt lower leg from a skin tear which was not removed.       Assessment:    GERD with known large hiatal hernia. Presently taking Protonix. Patient is not a candidate for surgery due to her underlying CAD. She is in poor physical health. Presently a Hospice patient.     Plan:    Nees to sleep with HOB up 45 degree. Patient needs to be fed her meal. Will reorder her Protonix.

## 2013-06-25 NOTE — ED Provider Notes (Signed)
CSN: [id number]     Arrival date & time 06/17/13  0301 History   First MD Initiated Contact with Patient 06/17/13 [phone]     Chief Complaint  Patient presents with  . Fall   (Consider location/radiation/quality/duration/timing/severity/associated sxs/prior Treatment) HPI  80 year old female presenting after a fall. She is coming from a facility. Patient was getting up to use the bathroom she lost her balance and fell. She fell onto her left side. This happened just before arrival. She does not think she hit her head. She denies pain anywhere. Noted to have a skin tear to her left lower leg. Denies or vomiting. No acute visual changes. No intervention prior to arrival.  . Past Medical History  Diagnosis Date  . Coronary artery disease   . Hypercholesterolemia   . Hypertension   . Mitral insufficiency   . Aortic stenosis   . Hiatal hernia   . GERD (gastroesophageal reflux disease)   . History of stomach ulcers   . Chronic headaches   . Anxiety and depression   . MI (myocardial infarction) 2005  . CHF (congestive heart failure)    Past Surgical History  Procedure Laterality Date  . Abdominal hysterectomy      in her 30's  . Hip fracture surgery    . Shoulder surgery Right   . Esophagogastroduodenoscopy N/A 08/12/2012    Procedure: ESOPHAGOGASTRODUODENOSCOPY (EGD);  Surgeon: Malissa Hippo, MD;  Location: [facility];  Service: Endoscopy;  Laterality: N/A;   No family history on file. History  Substance Use Topics  . Smoking status: Never Smoker   . Smokeless tobacco: Not on file  . Alcohol Use: No   OB History   Grav Para Term Preterm Abortions TAB SAB Ect Mult Living                 Review of Systems  All systems reviewed and negative, other than as noted in HPI.   Allergies  Allegra; Codeine; Naproxen; Nsaids; and Sulfa antibiotics  Home Medications   Current Outpatient Rx  Name  Route  Sig  Dispense  Refill  . acetaminophen (TYLENOL) 325 MG tablet   Oral    Take 2 tablets (650 mg total) by mouth every 6 (six) hours as needed.         . benzonatate (TESSALON) 100 MG capsule   Oral   Take 200 mg by mouth 3 (three) times daily.         . chlorhexidine (PERIDEX) 0.12 % solution   Mouth/Throat   Use as directed 15 mLs in the mouth or throat 3 (three) times daily.         . clonazePAM (KLONOPIN) 0.5 MG tablet   Oral   Take 0.25 mg by mouth See admin instructions. Take 1 tablet by mouth once daily as needed for anxiety.         Marland Kitchen escitalopram (LEXAPRO) 20 MG tablet   Oral   Take 20 mg by mouth daily.         . ferrous sulfate 325 (65 FE) MG tablet   Oral   Take 325 mg by mouth daily with breakfast.         . gabapentin (NEURONTIN) 100 MG capsule   Oral   Take 100 mg by mouth at bedtime.         Marland Kitchen guaiFENesin-dextromethorphan (ROBITUSSIN DM) 100-10 MG/5ML syrup   Oral   Take 5 mLs by mouth every 4 (four) hours as needed for cough.         Marland Kitchen  HYDROcodone-acetaminophen (NORCO) 10-325 MG per tablet   Oral   Take 1 tablet by mouth daily as needed (for pain).          . mirtazapine (REMERON) 15 MG tablet   Oral   Take 15 mg by mouth at bedtime.         . polyethylene glycol (MIRALAX / GLYCOLAX) packet   Oral   Take 17 g by mouth daily.         . potassium chloride SA (K-DUR,KLOR-CON) 20 MEQ tablet   Oral   Take 20 mEq by mouth 2 (two) times daily.         Marland Kitchen. tiZANidine (ZANAFLEX) 2 MG tablet   Oral   Take 2 mg by mouth 3 (three) times daily.         Marland Kitchen. torsemide (DEMADEX) 100 MG tablet   Oral   Take 100 mg by mouth daily.          . Travoprost, BAK Free, (TRAVATAN) 0.004 % SOLN ophthalmic solution   Both Eyes   Place 1 drop into both eyes at bedtime.         . traZODone (DESYREL) 50 MG tablet   Oral   Take 50 mg by mouth at bedtime.         . feeding supplement (ENSURE COMPLETE) LIQD   Oral   Take 237 mLs by mouth 2 (two) times daily between meals.         . pantoprazole (PROTONIX) 40  MG tablet   Oral   Take 1 tablet (40 mg total) by mouth daily.   90 tablet   3   . pantoprazole (PROTONIX) 40 MG tablet   Oral   Take 1 tablet (40 mg total) by mouth daily.   30 tablet   3    BP 141/45  Pulse 72  Temp(Src) 97.9 F (36.6 C) (Oral)  Resp 20  Ht 5\' 3"  (1.6 m)  Wt 148 lb (67.132 kg)  BMI 26.22 kg/m2  SpO2 91% Physical Exam  Nursing note and vitals reviewed. Constitutional: She appears well-developed and well-nourished. No distress.  HENT:  Head: Normocephalic and atraumatic.  Eyes: Conjunctivae are normal. Pupils are equal, round, and reactive to light. Right eye exhibits no discharge. Left eye exhibits no discharge.  Neck: Neck supple.  Cardiovascular: Normal rate, regular rhythm and normal heart sounds.  Exam reveals no gallop and no friction rub.   No murmur heard. Pulmonary/Chest: Effort normal and breath sounds normal. No respiratory distress.  Abdominal: Soft. She exhibits no distension. There is no tenderness.  Musculoskeletal: She exhibits no edema and no tenderness.  No midline spinal tenderness. "V" shaped skin tear at the left lateral lower leg. No active bleeding. No underlying bony tenderness. Neurovascular intact distally.  Neurological: She is alert.  Speech is clear. Responding appropriately to questions. Following commands. Strength is 5 over 5 bilateral upper lower extremities. Cranial nerves are intact.  Skin: Skin is warm and dry.  Psychiatric: She has a normal mood and affect. Her behavior is normal. Thought content normal.    ED Course  Procedures (including critical care time)  LACERATION REPAIR Performed by: Raeford RazorKOHUT, Jenesis Martin Authorized by: Raeford RazorKOHUT, Augusta Hilbert Consent: Verbal consent obtained. Risks and benefits: risks, benefits and alternatives were discussed Consent given by: patient Patient identity confirmed: provided demographic data Prepped and Draped in normal sterile fashion Wound explored  Laceration Location: L lateral  leg Laceration Length: 3 cm in total length  No Foreign Bodies  seen or palpated  Anesthesia: none  Anesthetic total: n/a  Irrigation method: syringe Amount of cleaning: standard  Skin closure: steristrips  Number of sutures: 5  Technique: steristrips  Patient tolerance: Patient tolerated the procedure well with no immediate complications.  Labs Review Labs Reviewed - No data to display Imaging Review No results found.  EKG Interpretation   None       MDM   1. Skin tear of left lower leg without complication   2. Fall     80 year old female presenting after a fall. She describes mechanical fall. She denies her head. She denies any pain anywhere. She does have a small skin tear on exam which is cleaned and approximated with Steri-Strips. No underlying bony tenderness. Neurovascularly intact. Very low suspicion for urgent/emergent traumatic injury. At this point plan continued wound care. Transfer back to her facility.    Raeford Razor, MD 06/25/13 1431

## 2013-07-28 DEATH — deceased

## 2014-07-24 IMAGING — CT CT CHEST W/ CM
2 of 5 series · 14 of 36 positions shown, 17 images · IV contrast (Omnipaque 300)
Comparison: 07/18/2012 radiograph

CT CHEST

***ADDENDUM*** CREATED: 07/18/2012 [DATE]

Air fluid collection along the posterior margin of the
duodenojejunal junction without surrounding inflammation is favored
to reflect a prominent diverticulum.
CLINICAL DATA: Epigastric pain, chest pain.  History of hiatal
hernia.
CT CHEST, ABDOMEN AND PELVIS WITH CONTRAST
TECHNIQUE: Multidetector CT imaging of the chest, abdomen and
pelvis was performed following the standard protocol during bolus
administration of intravenous contrast.
Contrast: 1 OMNIPAQUE IOHEXOL 300 MG/ML  SOLN, 80mL OMNIPAQUE
IOHEXOL 300 MG/ML  SOLN .

[Series 2: cap with 5.0 b40f · axial · 0.71mm/px · z∈[-496,-42]mm · 11 of 107 slices shown, 14 images]
[im 8/107  mediastinal]
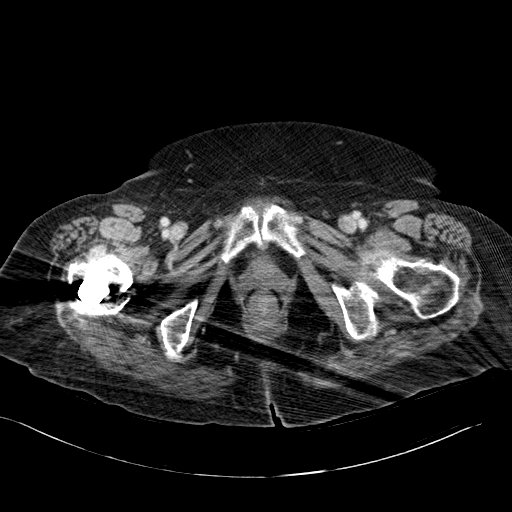
[im 8/107  lung]
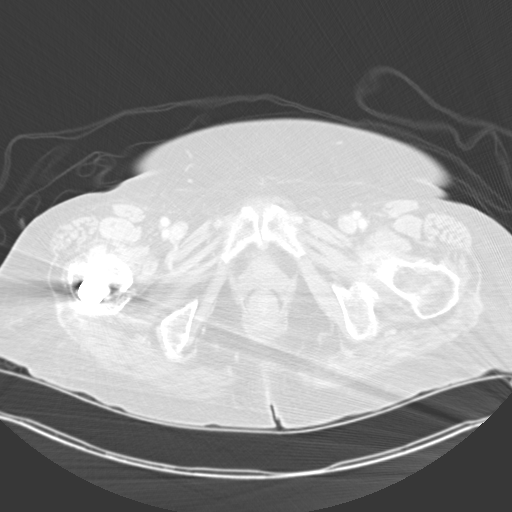
[im 15/107  lung]
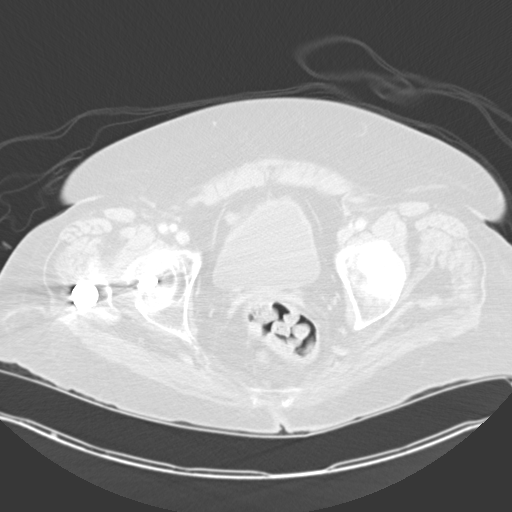
[im 29/107  lung]
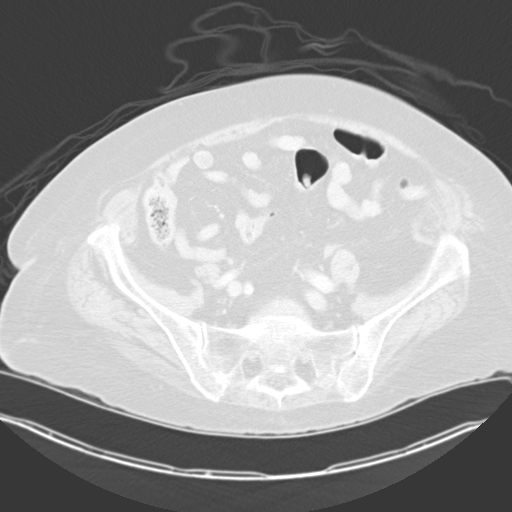
[im 36/107  lung]
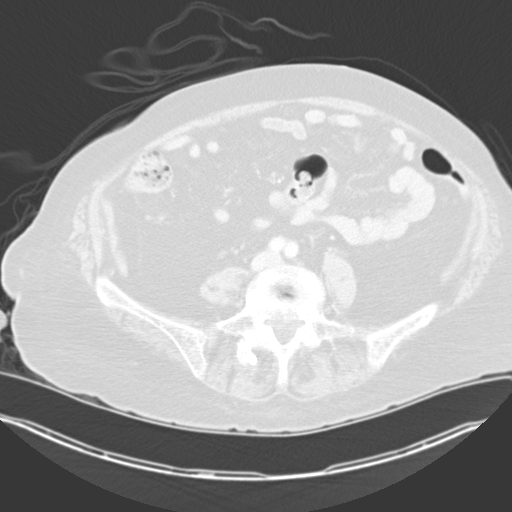
[im 43/107  mediastinal]
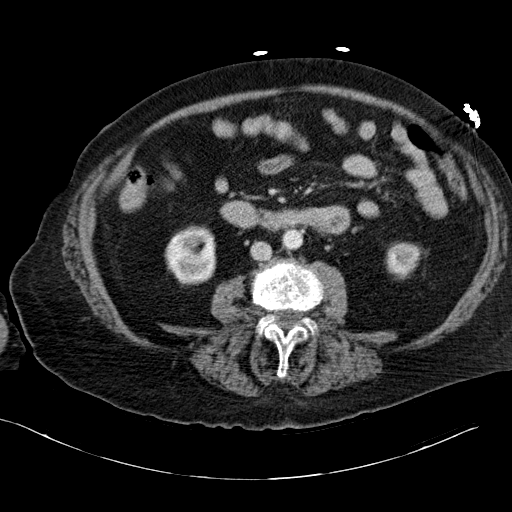
[im 43/107  lung]
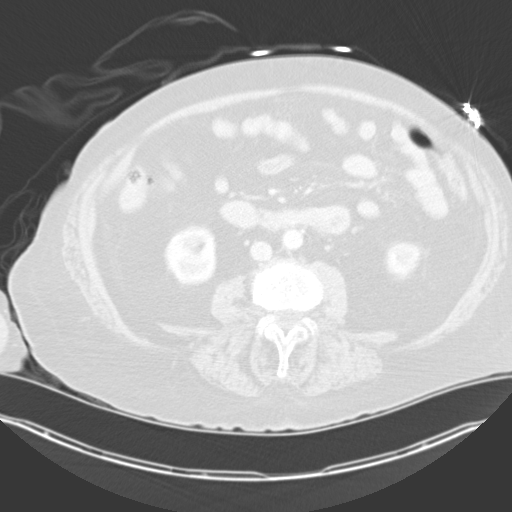
[im 57/107  lung]
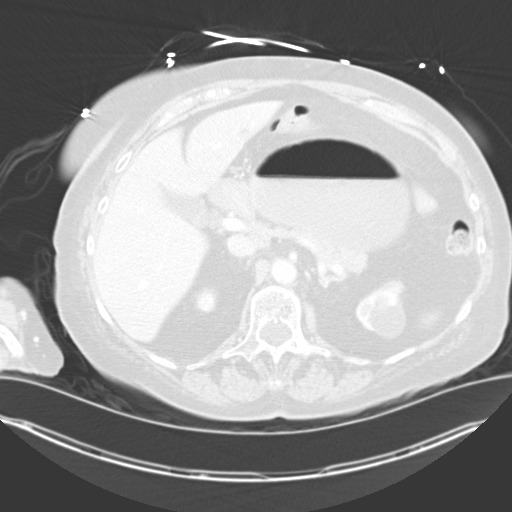
[im 64/107  lung]
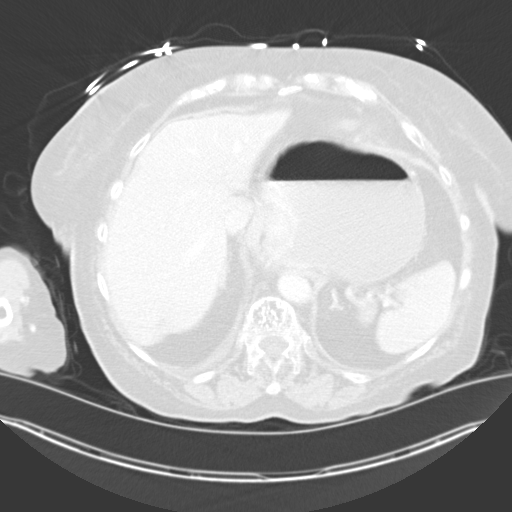
[im 71/107  lung]
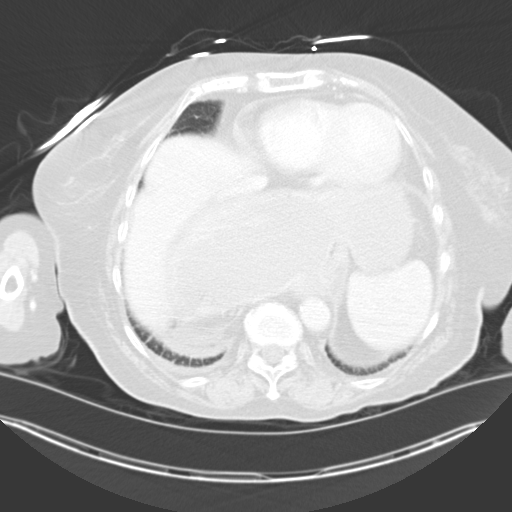
[im 78/107  mediastinal]
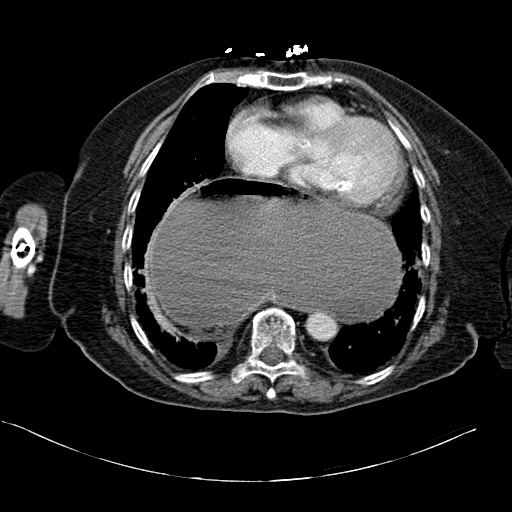
[im 78/107  lung]
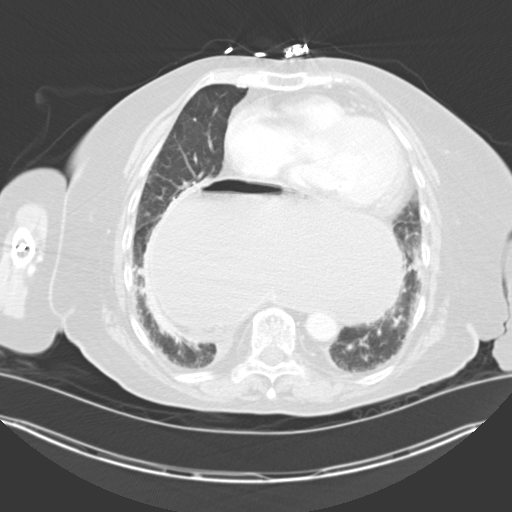
[im 92/107  lung]
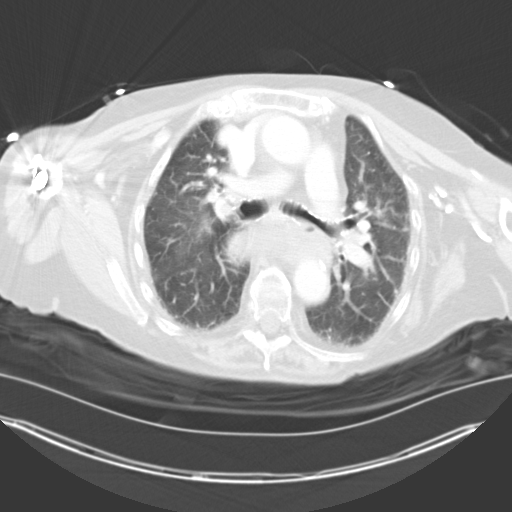
[im 99/107  lung]
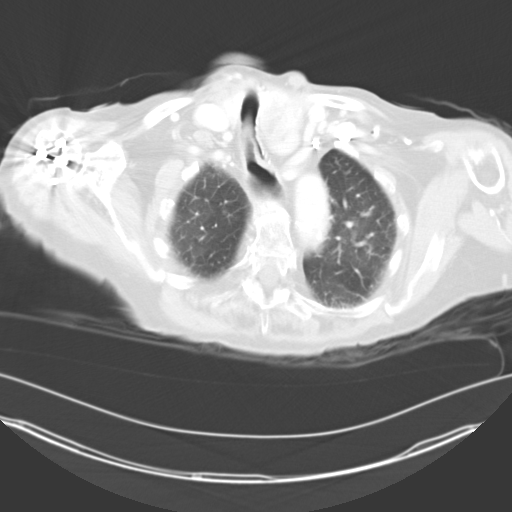

[Series 4: mpr cor post contrast (id) · coronal · 0.78mm/px · 3 of 107 slices shown]
[im 22/107  lung]
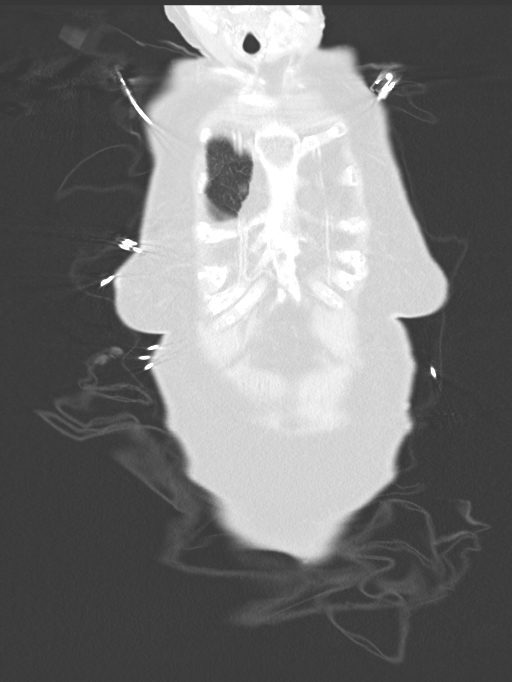
[im 43/107  lung]
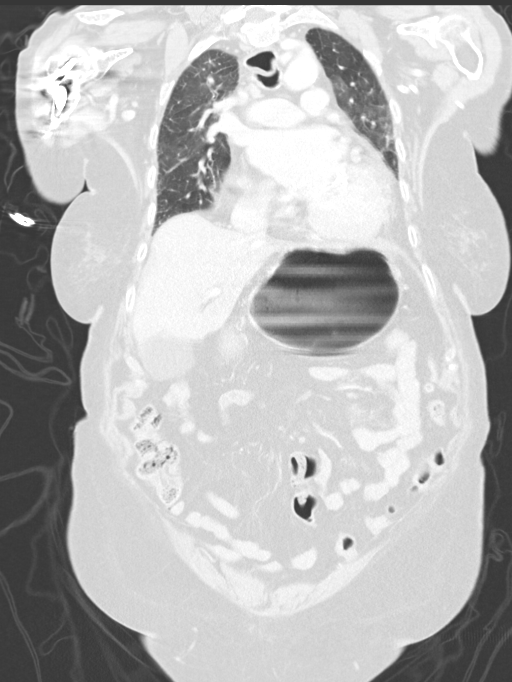
[im 64/107  lung]
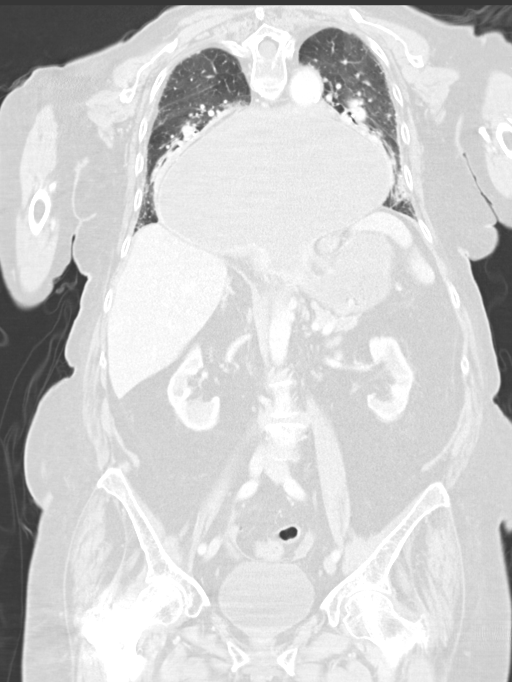

[14 of 36 positions shown; findings below may reference images not displayed]

FINDINGS: Enlarged thyroid with multiple nodules of varying
complexity.

Normal caliber aorta.  Heart displaced anteriorly by the large the
hiatal hernia.  There is organoaxial rotation.  The distal stomach
and small bowel loops are decompressed.

No lymphadenopathy.

Central airways are patent.  Mild peripheral reticular opacities.
Mild bibasilar atelectasis and trace effusions. Mild areas of
mosaic attenuation/atelectasis versus air trapping.

Osteopenia.  Right shoulder hardware.  Exaggerated kyphosis.
IMPRESSION: Markedly distended stomach, partially intrathoracic, with
organoaxial rotation.  Given the distal decompression, gastric
volvulus is of concern.

Complex left thyroid lobe nodules.  If clinically warranted,
consider ultrasound.

CT ABDOMEN AND PELVIS
FINDINGS: A few hepatic hypodensities are nonspecific, favor
cysts.  Unremarkable biliary system, spleen, adrenal glands.  Mild
dilatation of the pancreatic duct within the head/neck.  In
addition, there is dilatation of the side branch versus accessory
duct along the head/uncinate process.  Unremarkable adrenal glands.

Bilateral renal cysts and incompletely characterized hypodensities.
No hydronephrosis or hydroureter.

Colonic diverticulosis.  The colon and small bowel are essentially
decompressed.  There is mild stranding of the small bowel root
mesentery.  No lymphadenopathy.

Normal caliber aorta and branch vessels.

Left adnexal cyst has a nonaggressive appearance.  Thin-walled
bladder.  Absent uterus.

Partially imaged right hip hardware.  Diffuse osteopenia and
multilevel degenerative change.  T12 hemangioma.  Minimal
anterolisthesis of L4 on L5.
IMPRESSION: Decompressed small bowel and colon. See chest report above.

Discussed via telephone with Dr. Dezyderiusz at [DATE] a.m. on 07/18/2012.

## 2014-07-24 IMAGING — CR DG CHEST 1V PORT
2 series · 2 of 2 positions shown · non-contrast
Comparison: Recent CT chest abdomen pelvis 07/18/2012

CLINICAL DATA: Evaluate nasogastric tube position

PORTABLE CHEST - 1 VIEW

[AP (1 of 2)]
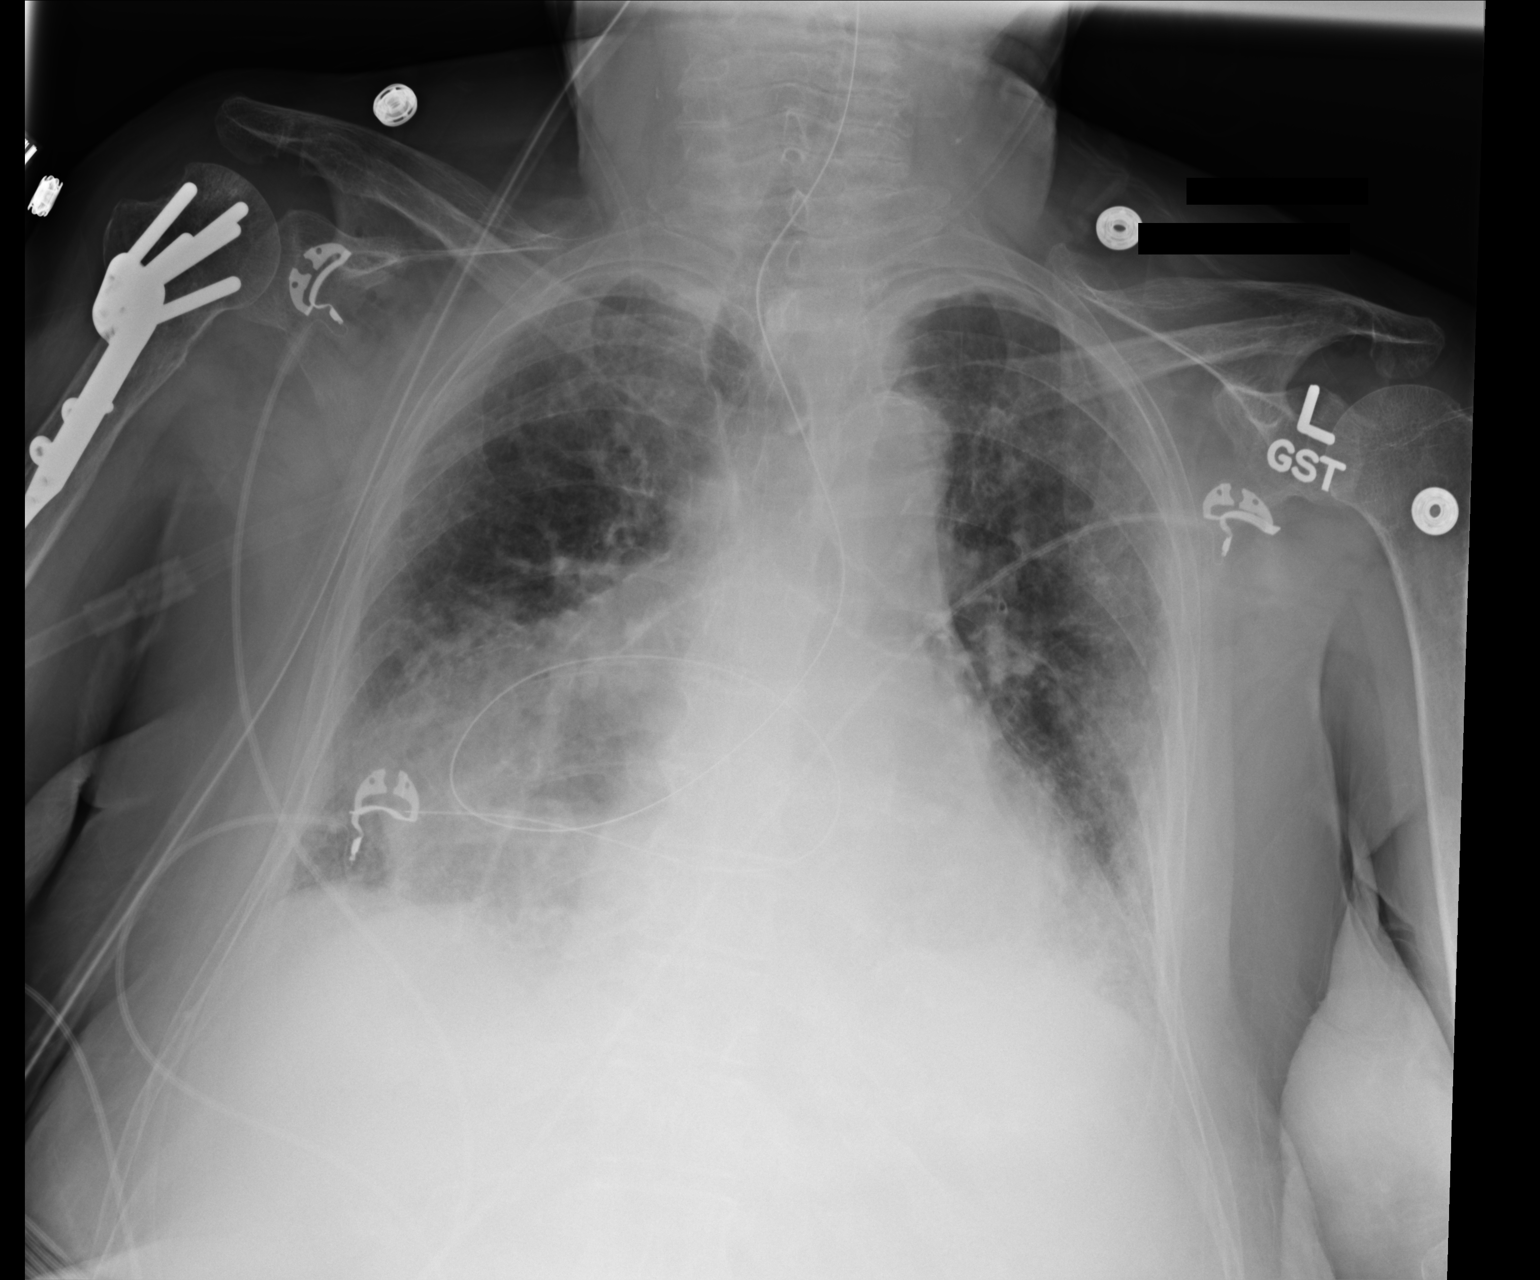

[AP (2 of 2)]
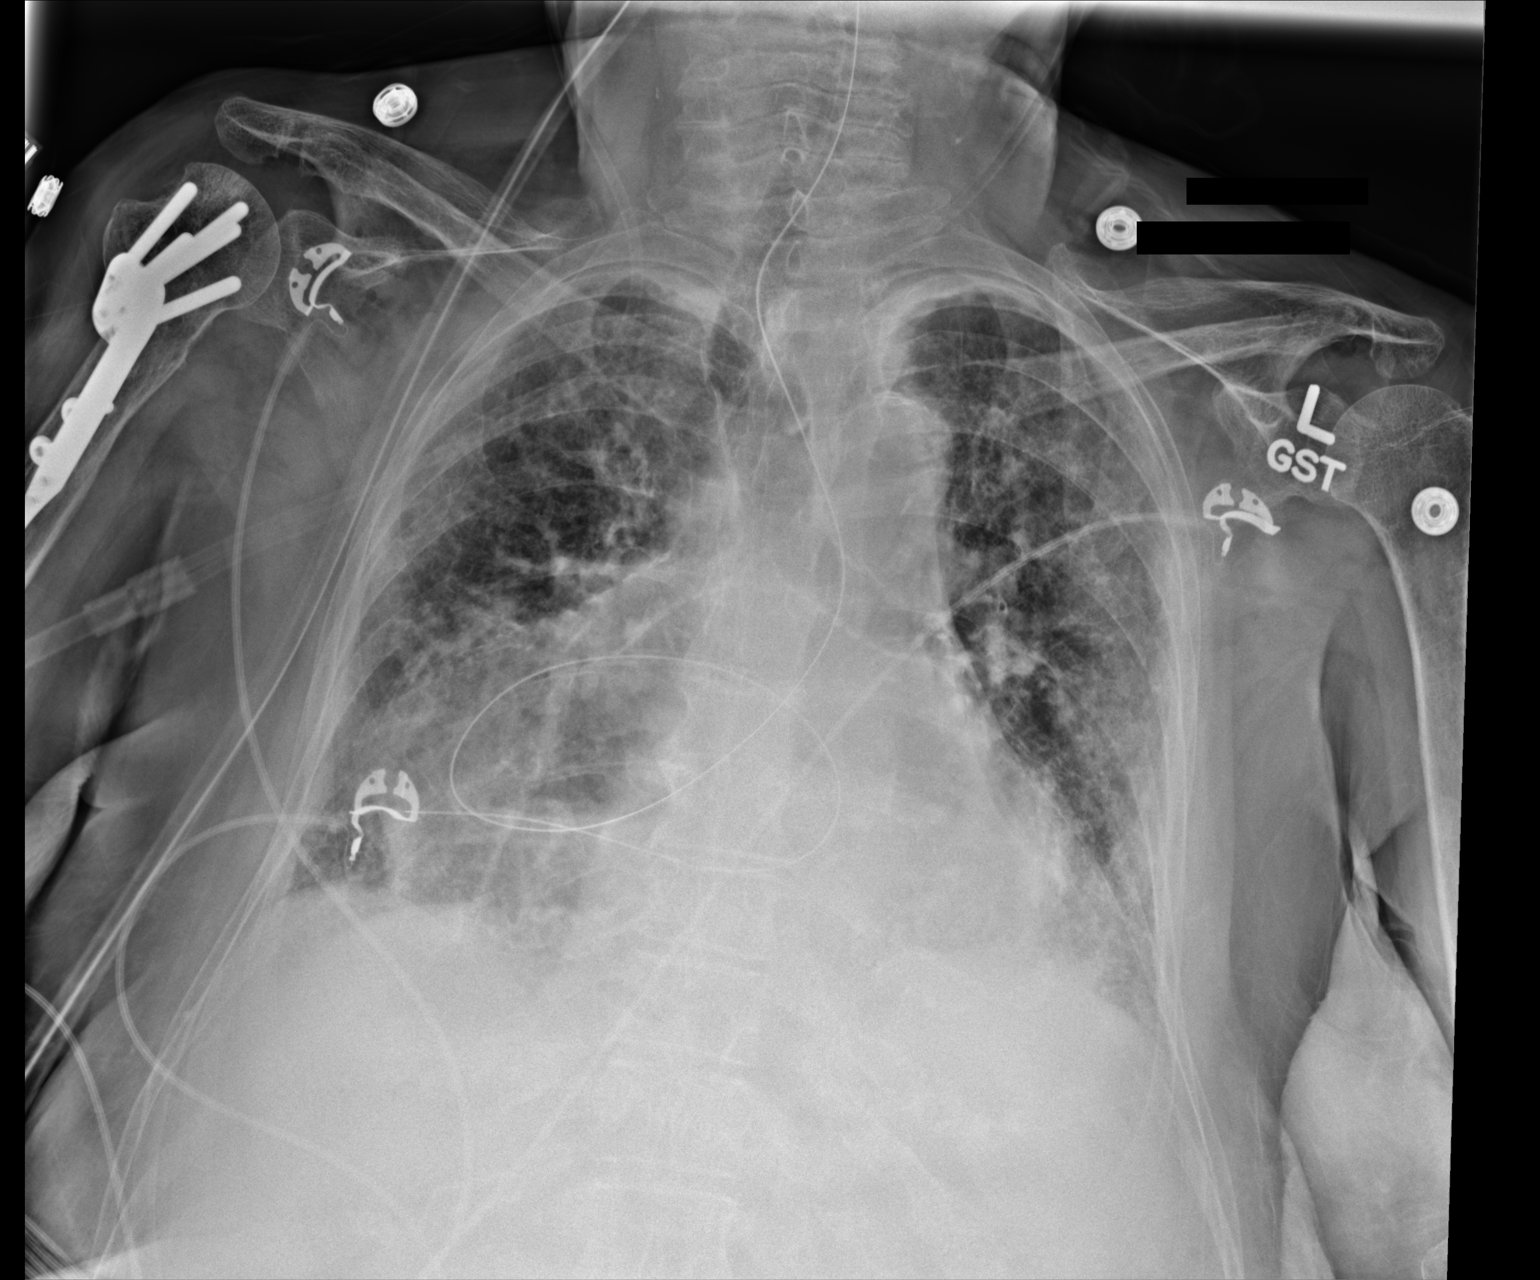

[2 of 2 positions shown; findings below may reference images not displayed]

FINDINGS: Interval placement of a nasogastric tube.  Nasogastric
tube is coiled in the mid thorax, likely within the intrathoracic
portion of the massive hiatal hernia.  Otherwise, unchanged
appearance of the chest.
IMPRESSION: The nasogastric tube is coiled within the intrathoracic portion of
the stomach.

## 2014-07-24 IMAGING — CR DG CHEST 1V PORT
1 series · 1 of 1 positions shown · non-contrast
Comparison: Prior chest x-ray 04/16/2007

CLINICAL DATA: Chest and epigastric pain

PORTABLE CHEST - 1 VIEW

[view not recorded]
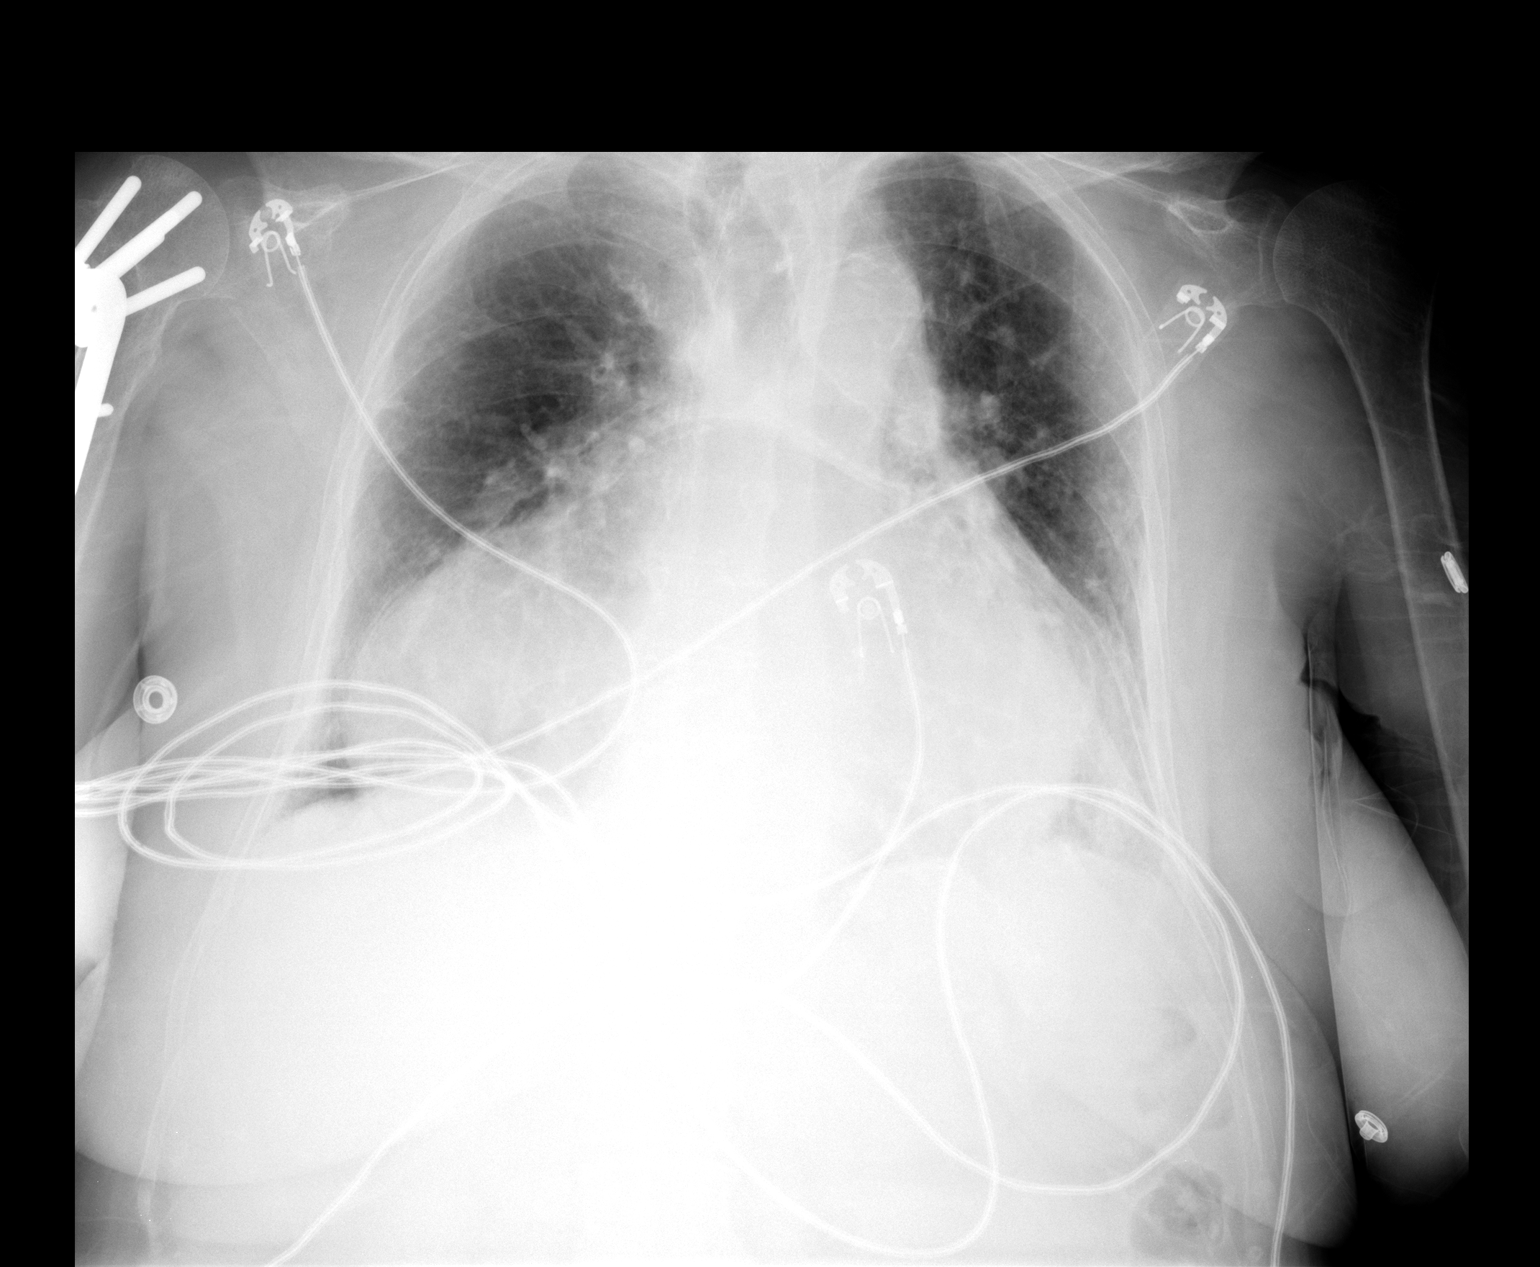

[1 of 1 positions shown; findings below may reference images not displayed]

FINDINGS: Compared to the remote prior study, there is been a
marked increase in the size of the hiatal hernia which now splays
the carina.  The cardiopericardial silhouette is largely obscured
by the large intrathoracic hiatal hernia.  Atherosclerotic
calcifications noted in the transverse aorta.  Chronic central
bronchitic changes and prominence of interstitial markings similar
to prior.  No acute osseous abnormality.  Prior ORIF of a right
humeral neck fracture.
IMPRESSION: 1.  Marked enlargement of massive hiatal hernia compared to the
prior chest x-ray from March 2007.
2.  No definite acute cardiopulmonary disease although the large
hiatal hernia obscures the majority of the thorax.
